# Patient Record
Sex: Female | Born: 1980
Health system: Southern US, Community
[De-identification: ages and names within clinical notes are randomized; demographics above are authoritative.]

## PROBLEM LIST (undated history)

## (undated) DIAGNOSIS — D219 Benign neoplasm of connective and other soft tissue, unspecified: Secondary | ICD-10-CM

## (undated) DIAGNOSIS — Q211 Atrial septal defect, unspecified: Secondary | ICD-10-CM

## (undated) DIAGNOSIS — D649 Anemia, unspecified: Secondary | ICD-10-CM

## (undated) HISTORY — DX: Atrial septal defect: Q21.1

## (undated) HISTORY — DX: Atrial septal defect, unspecified: Q21.10

## (undated) HISTORY — PX: FOOT SURGERY: SHX648

## (undated) HISTORY — PX: ESOPHAGOGASTRODUODENOSCOPY: SHX1529

## (undated) HISTORY — DX: Benign neoplasm of connective and other soft tissue, unspecified: D21.9

## (undated) HISTORY — DX: Anemia, unspecified: D64.9

## (undated) HISTORY — PX: OTHER SURGICAL HISTORY: SHX169

---

## 1999-07-23 ENCOUNTER — Encounter: Admission: RE | Admit: 1999-07-23 | Discharge: 1999-07-23 | Payer: Self-pay | Admitting: Internal Medicine

## 2011-06-25 DIAGNOSIS — Z98891 History of uterine scar from previous surgery: Secondary | ICD-10-CM | POA: Insufficient documentation

## 2011-12-15 DIAGNOSIS — Z9889 Other specified postprocedural states: Secondary | ICD-10-CM | POA: Insufficient documentation

## 2017-02-22 ENCOUNTER — Ambulatory Visit (INDEPENDENT_AMBULATORY_CARE_PROVIDER_SITE_OTHER): Payer: BLUE CROSS/BLUE SHIELD | Admitting: Advanced Practice Midwife

## 2017-02-22 ENCOUNTER — Encounter: Payer: Self-pay | Admitting: Advanced Practice Midwife

## 2017-02-22 VITALS — BP 110/76 | HR 81 | Ht 62.0 in | Wt 178.0 lb

## 2017-02-22 DIAGNOSIS — N898 Other specified noninflammatory disorders of vagina: Secondary | ICD-10-CM | POA: Diagnosis not present

## 2017-02-22 DIAGNOSIS — R350 Frequency of micturition: Secondary | ICD-10-CM | POA: Diagnosis not present

## 2017-02-22 MED ORDER — BORIC ACID CRYS: 600.0000 mg | CRYSTALS | Freq: Every day | 5 refills | Status: AC

## 2017-02-22 NOTE — Patient Instructions (Signed)
Bacterial Vaginosis Bacterial vaginosis is a vaginal infection that occurs when the normal balance of bacteria in the vagina is disrupted. It results from an overgrowth of certain bacteria. This is the most common vaginal infection among women ages 15-44. Because bacterial vaginosis increases your risk for STIs (sexually transmitted infections), getting treated can help reduce your risk for chlamydia, gonorrhea, herpes, and HIV (human immunodeficiency virus). Treatment is also important for preventing complications in pregnant women, because this condition can cause an early (premature) delivery. What are the causes? This condition is caused by an increase in harmful bacteria that are normally present in small amounts in the vagina. However, the reason that the condition develops is not fully understood. What increases the risk? The following factors may make you more likely to develop this condition:  Having a new sexual partner or multiple sexual partners.  Having unprotected sex.  Douching.  Having an intrauterine device (IUD).  Smoking.  Drug and alcohol abuse.  Taking certain antibiotic medicines.  Being pregnant.  You cannot get bacterial vaginosis from toilet seats, bedding, swimming pools, or contact with objects around you. What are the signs or symptoms? Symptoms of this condition include:  Grey or white vaginal discharge. The discharge can also be watery or foamy.  A fish-like odor with discharge, especially after sexual intercourse or during menstruation.  Itching in and around the vagina.  Burning or pain with urination.  Some women with bacterial vaginosis have no signs or symptoms. How is this diagnosed? This condition is diagnosed based on:  Your medical history.  A physical exam of the vagina.  Testing a sample of vaginal fluid under a microscope to look for a large amount of bad bacteria or abnormal cells. Your health care provider may use a cotton swab  or a small wooden spatula to collect the sample.  How is this treated? This condition is treated with antibiotics. These may be given as a pill, a vaginal cream, or a medicine that is put into the vagina (suppository). If the condition comes back after treatment, a second round of antibiotics may be needed. Follow these instructions at home: Medicines  Take over-the-counter and prescription medicines only as told by your health care provider.  Take or use your antibiotic as told by your health care provider. Do not stop taking or using the antibiotic even if you start to feel better. General instructions  If you have a female sexual partner, tell her that you have a vaginal infection. She should see her health care provider and be treated if she has symptoms. If you have a female sexual partner, he does not need treatment.  During treatment: ? Avoid sexual activity until you finish treatment. ? Do not douche. ? Avoid alcohol as directed by your health care provider. ? Avoid breastfeeding as directed by your health care provider.  Drink enough water and fluids to keep your urine clear or pale yellow.  Keep the area around your vagina and rectum clean. ? Wash the area daily with warm water. ? Wipe yourself from front to back after using the toilet.  Keep all follow-up visits as told by your health care provider. This is important. How is this prevented?  Do not douche.  Wash the outside of your vagina with warm water only.  Use protection when having sex. This includes latex condoms and dental dams.  Limit how many sexual partners you have. To help prevent bacterial vaginosis, it is best to have sex with just   one partner (monogamous).  Make sure you and your sexual partner are tested for STIs.  Wear cotton or cotton-lined underwear.  Avoid wearing tight pants and pantyhose, especially during summer.  Limit the amount of alcohol that you drink.  Do not use any products that  contain nicotine or tobacco, such as cigarettes and e-cigarettes. If you need help quitting, ask your health care provider.  Do not use illegal drugs. Where to find more information:  Centers for Disease Control and Prevention: www.cdc.gov/std  American Sexual Health Association (ASHA): www.ashastd.org  U.S. Department of Health and Human Services, Office on Women's Health: www.womenshealth.gov/ or https://www.womenshealth.gov/a-z-topics/bacterial-vaginosis Contact a health care provider if:  Your symptoms do not improve, even after treatment.  You have more discharge or pain when urinating.  You have a fever.  You have pain in your abdomen.  You have pain during sex.  You have vaginal bleeding between periods. Summary  Bacterial vaginosis is a vaginal infection that occurs when the normal balance of bacteria in the vagina is disrupted.  Because bacterial vaginosis increases your risk for STIs (sexually transmitted infections), getting treated can help reduce your risk for chlamydia, gonorrhea, herpes, and HIV (human immunodeficiency virus). Treatment is also important for preventing complications in pregnant women, because the condition can cause an early (premature) delivery.  This condition is treated with antibiotic medicines. These may be given as a pill, a vaginal cream, or a medicine that is put into the vagina (suppository). This information is not intended to replace advice given to you by your health care provider. Make sure you discuss any questions you have with your health care provider. Document Released: 04/13/2005 Document Revised: 12/28/2015 Document Reviewed: 12/28/2015 Elsevier Interactive Patient Education  2017 Elsevier Inc.  

## 2017-02-22 NOTE — Progress Notes (Signed)
Subjective:     Patient ID: Tiffany Dixon, female   DOB: [birth year], 36 y.o.   MRN: [id number]  HPI: Here for New patient problems Gyn visit. C/O malodorous vaginal discharge C/W her recurrent BV and urinary frequency primarily at night.   Has taken Flagyl and Metrogel many times w/out relief of Sx. Was even placed on weekly Metrogel after Full week course of Flagyl w/out improvement. Often gets BV after periods stop. Is not currently sexually active. Denies risks for STDs and declines testing. Does not want to try Flagyl or Metrogel again. Has had most success w/ Boric Acid.   Last Pap 2017 Nml w/ neg co-testing.   Review of Systems  Constitutional: Negative for chills and fever.  Gastrointestinal: Negative for abdominal pain.  Endocrine: Negative for polydipsia and polyuria.  Genitourinary: Positive for frequency and vaginal discharge. Negative for decreased urine volume, difficulty urinating, dysuria, flank pain, genital sores, hematuria, menstrual problem, pelvic pain, urgency, vaginal bleeding and vaginal pain.       Objective:BP 110/76   Pulse 81   Ht 5\' 2"  (1.575 m)   Wt 178 lb (80.7 kg)   LMP 02/18/2017 (LMP Unknown)   BMI 32.56 kg/m    Physical Exam  Constitutional: She is oriented to person, place, and time. She appears well-developed and well-nourished. No distress.  Eyes: No scleral icterus.  Neck: Normal range of motion.  Cardiovascular: Normal rate.   Pulmonary/Chest: Effort normal.  Abdominal: Soft. There is no tenderness.  Genitourinary: There is no rash, tenderness or lesion on the right labia. There is no rash, tenderness or lesion on the left labia. No erythema, tenderness or bleeding in the vagina. Vaginal discharge found.  Musculoskeletal: Normal range of motion.  Neurological: She is alert and oriented to person, place, and time.  Skin: Skin is warm and dry.  Psychiatric: She has a normal mood and affect.  Nursing note and vitals reviewed.  UA neg     Assessment/Plan:     1. Vaginal discharge  - Cervicovaginal ancillary only - Boric Acid CRYS; Place 600 mg vaginally daily. Take daily x 30 days, then use for 7 days after each menstrual period stops if you develop symptoms of BV.  Dispense: 500 g; Refill: 5  2. Urinary frequency  - Nml UA suggests no infection - Discussed other causes of urinary frequency such as IC, overactive bladder. Pt denies significant Sx and declined further workup for now.   Tamala Julian, [place], [place] 02/22/2017 3:44 PM

## 2017-02-22 NOTE — Progress Notes (Signed)
Pt c/o of reoccurring BV.

## 2017-02-23 LAB — CERVICOVAGINAL ANCILLARY ONLY
Bacterial vaginitis: POSITIVE — AB
Candida vaginitis: NEGATIVE

## 2017-02-24 ENCOUNTER — Telehealth: Payer: Self-pay

## 2017-02-24 NOTE — Telephone Encounter (Signed)
Left message on pt's phone asking her to call back for her lab results

## 2017-06-02 DIAGNOSIS — M2012 Hallux valgus (acquired), left foot: Secondary | ICD-10-CM | POA: Diagnosis not present

## 2017-06-02 DIAGNOSIS — S93149A Subluxation of metatarsophalangeal joint of unspecified toe(s), initial encounter: Secondary | ICD-10-CM | POA: Diagnosis not present

## 2017-06-02 DIAGNOSIS — M79672 Pain in left foot: Secondary | ICD-10-CM | POA: Diagnosis not present

## 2017-07-07 DIAGNOSIS — M24672 Ankylosis, left ankle: Secondary | ICD-10-CM | POA: Diagnosis not present

## 2017-07-07 DIAGNOSIS — M2042 Other hammer toe(s) (acquired), left foot: Secondary | ICD-10-CM | POA: Diagnosis not present

## 2017-07-07 DIAGNOSIS — M2012 Hallux valgus (acquired), left foot: Secondary | ICD-10-CM | POA: Diagnosis not present

## 2017-07-07 DIAGNOSIS — M21612 Bunion of left foot: Secondary | ICD-10-CM | POA: Diagnosis not present

## 2017-07-14 DIAGNOSIS — M79672 Pain in left foot: Secondary | ICD-10-CM | POA: Diagnosis not present

## 2017-07-14 DIAGNOSIS — Z4889 Encounter for other specified surgical aftercare: Secondary | ICD-10-CM | POA: Diagnosis not present

## 2017-07-14 DIAGNOSIS — Z4789 Encounter for other orthopedic aftercare: Secondary | ICD-10-CM | POA: Diagnosis not present

## 2017-09-13 DIAGNOSIS — Z4889 Encounter for other specified surgical aftercare: Secondary | ICD-10-CM | POA: Diagnosis not present

## 2017-09-18 DIAGNOSIS — D259 Leiomyoma of uterus, unspecified: Secondary | ICD-10-CM | POA: Diagnosis not present

## 2017-09-18 DIAGNOSIS — Z79891 Long term (current) use of opiate analgesic: Secondary | ICD-10-CM | POA: Diagnosis not present

## 2017-09-18 DIAGNOSIS — R9389 Abnormal findings on diagnostic imaging of other specified body structures: Secondary | ICD-10-CM | POA: Diagnosis not present

## 2017-09-18 DIAGNOSIS — R102 Pelvic and perineal pain: Secondary | ICD-10-CM | POA: Diagnosis not present

## 2017-09-18 DIAGNOSIS — N854 Malposition of uterus: Secondary | ICD-10-CM | POA: Diagnosis not present

## 2017-09-18 DIAGNOSIS — N938 Other specified abnormal uterine and vaginal bleeding: Secondary | ICD-10-CM | POA: Diagnosis not present

## 2017-09-18 DIAGNOSIS — F17200 Nicotine dependence, unspecified, uncomplicated: Secondary | ICD-10-CM | POA: Diagnosis not present

## 2017-09-18 DIAGNOSIS — Z79899 Other long term (current) drug therapy: Secondary | ICD-10-CM | POA: Diagnosis not present

## 2017-09-18 DIAGNOSIS — Z791 Long term (current) use of non-steroidal anti-inflammatories (NSAID): Secondary | ICD-10-CM | POA: Diagnosis not present

## 2017-09-27 ENCOUNTER — Encounter: Payer: Self-pay | Admitting: Certified Nurse Midwife

## 2017-09-27 ENCOUNTER — Ambulatory Visit (INDEPENDENT_AMBULATORY_CARE_PROVIDER_SITE_OTHER): Payer: BLUE CROSS/BLUE SHIELD | Admitting: Certified Nurse Midwife

## 2017-09-27 VITALS — BP 116/77 | HR 78 | Ht 61.0 in | Wt 185.0 lb

## 2017-09-27 DIAGNOSIS — Z3009 Encounter for other general counseling and advice on contraception: Secondary | ICD-10-CM

## 2017-09-27 NOTE — Progress Notes (Signed)
   GYNECOLOGY OFFICE VISIT NOTE  History:  37 y.o. [id number] here today for birth control options and discuss recent finding of uterine fibroids. She is not currently sexually active but interested in contraception. She has used OCPs and Depo in the past and is interested in other options. Reports bleeding twice last month and was seen in the ED for this. Pelvic US on 09/18/17 showed: Multiple lesions of the uterus apparently representing fibroids numbering at least 3, the largest of which measures approximately 2.8 cm. 2.  No collection or fluid of the endometrial canal. 3.  Probable physiologic functional cyst of the right adnexa measuring up to 1.5 cm with possible hemorrhagic follicle of the left adnexa measuring approximately 1.8 cm.  Past Medical History:  Diagnosis Date  . Atrial septal defect determined by imaging     Past Surgical History:  Procedure Laterality Date  . CESAREAN SECTION    . FOOT SURGERY    . open heart surgery      The following portions of the patient's history were reviewed and updated as appropriate: allergies, current medications, past family history, past medical history, past social history, past surgical history and problem list.   Review of Systems:  No VB No vaginal discharge No cramping  Objective:  Physical Exam BP 116/77   Pulse 78   Ht 5\' 1"  (1.549 m)   Wt 185 lb (83.9 kg)   LMP 09/19/2017   BMI 34.96 kg/m  CONSTITUTIONAL: Well-developed, well-nourished female in no acute distress.  HENT:  Normocephalic, atraumatic.  EYES: Conjunctivae and EOM are normal NECK: Normal range of motion SKIN: Skin is warm and dry. No rash noted. Not diaphoretic. No erythema. No pallor. NEUROLOGIC: Alert and oriented to person, place, and time.  PSYCHIATRIC: Normal mood and affect. Normal behavior.  CARDIOVASCULAR: Normal heart rate noted RESPIRATORY: Effort and breath sounds normal, no problems with respiration noted PELVIC: declined MUSCULOSKELETAL:  Normal range of motion. No edema noted.  Labs and Imaging No results found.  Assessment & Plan:  1. Counseling for contraceptive management - discussed all options of Mirena, Paragard, and Nexplanon, brochures given - pt will read literature and call back with desired option - if begins IC prior to [facility] selection, should use barrier method  2. AUB - single episode of AUB, unclear if caused by fibroids - would consider further evaluation with MD if AUB persists  Total face-to-face time with patient: 15 minutes.  Over 50% of encounter was spent on counseling and coordination of care.   Julianne Handler, CNM 09/28/2017 1:51 PM

## 2017-09-27 NOTE — Progress Notes (Signed)
Last pap Oct 2017- normal results Pt does not want exam

## 2017-09-28 ENCOUNTER — Encounter: Payer: Self-pay | Admitting: Certified Nurse Midwife

## 2017-11-03 ENCOUNTER — Telehealth: Payer: Self-pay | Admitting: Certified Nurse Midwife

## 2017-11-03 MED ORDER — IBUPROFEN 800 MG PO TABS: 800.0000 mg | ORAL_TABLET | Freq: Four times a day (QID) | ORAL | 0 refills | Status: DC | PRN

## 2017-11-03 NOTE — Telephone Encounter (Signed)
Pt called to say she is having the same symptoms again. Pt states she started her cycle on 24th and it ended 28th. Now having clots beginning 11/01/17. Heavy bleeding. Leaving town. Pt states Ibuprofen given before in ED visit. Pt will gone tomorrow to [place] and be back 11/09/17.  Please call in Ibuprofen for her to take with her on her trip tomorrow. [facility] on [address]. Pt states available on work cell or personal cell at any time today.

## 2017-12-20 ENCOUNTER — Ambulatory Visit: Payer: BLUE CROSS/BLUE SHIELD | Admitting: Obstetrics & Gynecology

## 2018-01-27 ENCOUNTER — Ambulatory Visit: Payer: BLUE CROSS/BLUE SHIELD | Admitting: Obstetrics & Gynecology

## 2018-01-31 ENCOUNTER — Encounter: Payer: Self-pay | Admitting: Obstetrics & Gynecology

## 2018-01-31 ENCOUNTER — Ambulatory Visit: Payer: BLUE CROSS/BLUE SHIELD | Admitting: Obstetrics & Gynecology

## 2018-01-31 VITALS — BP 114/71 | HR 78 | Resp 16 | Ht 61.0 in | Wt 191.0 lb

## 2018-01-31 DIAGNOSIS — N938 Other specified abnormal uterine and vaginal bleeding: Secondary | ICD-10-CM | POA: Diagnosis not present

## 2018-01-31 DIAGNOSIS — Z23 Encounter for immunization: Secondary | ICD-10-CM | POA: Diagnosis not present

## 2018-01-31 DIAGNOSIS — Z0000 Encounter for general adult medical examination without abnormal findings: Secondary | ICD-10-CM

## 2018-01-31 DIAGNOSIS — Z124 Encounter for screening for malignant neoplasm of cervix: Secondary | ICD-10-CM | POA: Diagnosis not present

## 2018-01-31 DIAGNOSIS — Z1151 Encounter for screening for human papillomavirus (HPV): Secondary | ICD-10-CM

## 2018-01-31 MED ORDER — METRONIDAZOLE 500 MG PO TABS: 500.0000 mg | ORAL_TABLET | Freq: Two times a day (BID) | ORAL | 0 refills | Status: DC

## 2018-01-31 NOTE — Progress Notes (Signed)
Patient ID: Tiffany Dixon, female   DOB: [birth year], 37 y.o.   MRN: [id number]  Chief Complaint  Patient presents with  . Menstrual Problem    HPI Tiffany Dixon is a 37 y.o. female. Single P2 (70 and 73 yo kids) here today as a new patient with the issue of  Irregular periods. She has had 2 months recently when she had 2 periods per month. These were heavy with clots. She went to the [facility] ER and was told she has fibroids. Her hbg was 9.8 on 5/19. She can't recall anything that makes this better or worse.   She has been abstinent for about 2 years. She has used OCPs for contraception. She also used depo provera but didn't like it.   She also thinks that she has a hemorrhoid.   Past Medical History:  Diagnosis Date  . Anemia   . Atrial septal defect determined by imaging     Past Surgical History:  Procedure Laterality Date  . CESAREAN SECTION    . FOOT SURGERY    . open heart surgery     ASD repair, age 49    Family History  Problem Relation Age of Onset  . Melanoma Mother     Social History Social History   Tobacco Use  . Smoking status: Never Smoker  . Smokeless tobacco: Never Used  Substance Use Topics  . Alcohol use: Yes    Comment: Socially  . Drug use: No    No Known Allergies  Current Outpatient Medications  Medication Sig Dispense Refill  . Boric Acid GRAN USE ONE CAPSULE VAGINALLY AS DIRECTED    . ferrous sulfate 325 (65 FE) MG tablet Take 325 mg by mouth daily with breakfast.    . ibuprofen (ADVIL,MOTRIN) 800 MG tablet Take by mouth.     No current facility-administered medications for this visit.     Review of Systems Review of Systems She reports that her belly gets very large at night, back to normal in the mornings.  Blood pressure 114/71, pulse 78, resp. rate 16, height 5\' 1"  (1.549 m), weight 191 lb (86.6 kg), last menstrual period 01/19/2018.  Physical Exam Physical Exam  Breathing, conversing, and ambulating normally Well  nourished, well hydrated Black female, no apparent distress Abd- obese, benign Vaginal discharge c/w BV (She has a h/o this, recurs) normal size and shape, anteverted, mobile, non-tender, normal adnexal exam  Data Reviewed I have reviewed info from [facility] in 5/19.  Assessment    Preventative care DUB Recurrent BV    Plan    TDAP, flu vaccines Pap smear today  Check CBC, TSH, gyn u/s  Flagyl prescribed Rec boric acic supp QOhs Rec probiotics  Come back 2 weeks       Tiffany Dixon 01/31/2018, 10:15 AM

## 2018-02-01 LAB — CBC
HEMATOCRIT: 35.3 % (ref 35.0–45.0)
Hemoglobin: 11.3 g/dL — ABNORMAL LOW (ref 11.7–15.5)
MCH: 27.8 pg (ref 27.0–33.0)
MCHC: 32 g/dL (ref 32.0–36.0)
MCV: 86.9 fL (ref 80.0–100.0)
MPV: 9.8 fL (ref 7.5–12.5)
Platelets: 395 10*3/uL (ref 140–400)
RBC: 4.06 10*6/uL (ref 3.80–5.10)
RDW: 12.7 % (ref 11.0–15.0)
WBC: 6.5 10*3/uL (ref 3.8–10.8)

## 2018-02-01 LAB — TSH: TSH: 1.38 m[IU]/L

## 2018-02-02 LAB — CYTOLOGY - PAP
Adequacy: ABSENT
Diagnosis: NEGATIVE
HPV: NOT DETECTED

## 2018-02-07 ENCOUNTER — Ambulatory Visit: Payer: BLUE CROSS/BLUE SHIELD

## 2018-03-31 ENCOUNTER — Other Ambulatory Visit: Payer: Self-pay | Admitting: *Deleted

## 2018-03-31 MED ORDER — BORIC ACID EX GRAN: GRANULES | CUTANEOUS | 0 refills | Status: DC

## 2018-04-26 ENCOUNTER — Ambulatory Visit (INDEPENDENT_AMBULATORY_CARE_PROVIDER_SITE_OTHER): Payer: BLUE CROSS/BLUE SHIELD

## 2018-04-26 DIAGNOSIS — D252 Subserosal leiomyoma of uterus: Secondary | ICD-10-CM | POA: Diagnosis not present

## 2018-04-26 DIAGNOSIS — N939 Abnormal uterine and vaginal bleeding, unspecified: Secondary | ICD-10-CM | POA: Diagnosis not present

## 2018-05-05 ENCOUNTER — Encounter: Payer: Self-pay | Admitting: Obstetrics & Gynecology

## 2018-05-05 ENCOUNTER — Ambulatory Visit: Payer: BLUE CROSS/BLUE SHIELD | Admitting: Obstetrics & Gynecology

## 2018-05-05 VITALS — BP 112/77 | HR 80 | Ht 61.0 in | Wt 191.0 lb

## 2018-05-05 DIAGNOSIS — R140 Abdominal distension (gaseous): Secondary | ICD-10-CM

## 2018-05-05 MED ORDER — HYDROCORTISONE ACETATE 25 MG RE SUPP: 25.0000 mg | Freq: Two times a day (BID) | RECTAL | 1 refills | Status: DC

## 2018-05-05 NOTE — Progress Notes (Signed)
Patient ID: Tiffany Dixon, female   DOB: Sep 28, 1980, 38 y.o.   MRN: [id number]  Chief Complaint  Patient presents with  . Results    HPI Tiffany Dixon is a 38 y.o. here today for follow up after being seen 10/19 for DUB. Her w/u included an u/s that showed a 2.1 cm subserosal fibroid, normal endometrium, normal TSH, hbg of 11.3.   Since that time her periods have regulated.  She would like a prescription for hemorrhoids.  She is also concerned about bloating that has been going on for "a long time, at least 8 months". She notices this at its most extreme prior to going to sleep, wakes up and her abdomen is much flatter. Looks pregnant prior to going to sleep.  Feels "weird and heavy". HPI  Past Medical History:  Diagnosis Date  . Anemia   . Atrial septal defect determined by imaging     Past Surgical History:  Procedure Laterality Date  . CESAREAN SECTION    . FOOT SURGERY    . open heart surgery     ASD repair, age 7    Family History  Problem Relation Age of Onset  . Melanoma Mother     Social History Social History   Tobacco Use  . Smoking status: Never Smoker  . Smokeless tobacco: Never Used  Substance Use Topics  . Alcohol use: Yes    Comment: Socially  . Drug use: No    No Known Allergies  Current Outpatient Medications  Medication Sig Dispense Refill  . Boric Acid GRAN USE ONE CAPSULE VAGINALLY AS DIRECTED  600mg  caps 21 Bottle 0  . ferrous sulfate 325 (65 FE) MG tablet Take 325 mg by mouth daily with breakfast.    . ibuprofen (ADVIL,MOTRIN) 800 MG tablet Take by mouth.    . hydrocortisone (ANUSOL-HC) 25 MG suppository Place 1 suppository (25 mg total) rectally 2 (two) times daily. 12 suppository 1  . metroNIDAZOLE (FLAGYL) 500 MG tablet Take 1 tablet (500 mg total) by mouth 2 (two) times daily. (Patient not taking: Reported on 05/05/2018) 14 tablet 0   No current facility-administered medications for this visit.     Review of Systems Review of  Systems  Blood pressure 112/77, pulse 80, height 5\' 1"  (1.549 m), weight 86.6 kg.  Physical Exam Physical Exam Breathing, conversing, and ambulating normally Well nourished, well hydrated Black female, no apparent distress  Data Reviewed U/s, labs  Assessment    Bloating hemorrhoids    Plan    annusol prescribed Refer to GI Trial of Gas X prn       Jerusalem Wert C Amberli Ruegg 05/05/2018, 1:33 PM

## 2018-05-18 ENCOUNTER — Other Ambulatory Visit: Payer: Self-pay | Admitting: *Deleted

## 2018-05-18 ENCOUNTER — Telehealth: Payer: Self-pay | Admitting: *Deleted

## 2018-05-18 MED ORDER — NORGESTREL-ETHINYL ESTRADIOL 0.3-30 MG-MCG PO TABS: 1.0000 | ORAL_TABLET | Freq: Every day | ORAL | 11 refills | Status: DC

## 2018-05-18 MED ORDER — BORIC ACID EX GRAN: GRANULES | CUTANEOUS | 5 refills | Status: DC

## 2018-05-18 NOTE — Telephone Encounter (Signed)
Pt wants to start OCP's as was discussed at her last visit with Dr Hulan Fray.  I have spoken with Dr Hulan Fray and she will start patient on Lo Ovral .  This RX was sent to [facility] in [place].

## 2018-07-07 ENCOUNTER — Telehealth: Payer: Self-pay | Admitting: *Deleted

## 2018-07-07 MED ORDER — IBUPROFEN 800 MG PO TABS: 800.0000 mg | ORAL_TABLET | Freq: Four times a day (QID) | ORAL | 1 refills | Status: DC | PRN

## 2018-07-07 NOTE — Telephone Encounter (Signed)
Pt called stating that she has recently been seen for vaginal bleeding and was prescribed OCP's.  She took only 4 pills then stopped because they make her feel dry in her vaginal and gave her dry mouth.  Now she has been light bleeding for 5 days.  She is requesting that she be given something to stop the bleeding and for the cramps.  I explained that stopping the pills in midstream is the reason she is bleeding again as she had a progesterone withdrawal.  She states that she has been taking OTC Ibuprofen 200 mg.  I told her she could take 4 of the 200 mgs and it would be the same as prescription strength.  She is still insisting on getting the 800 mg by prescription.  I told her that if she is not any better or if the bleeding doesn't stop she may need to be reevaluated.  Pt stated,"I don't know why I should have to come back in and tell the Dr the same thing I told her before."

## 2018-08-15 ENCOUNTER — Ambulatory Visit (INDEPENDENT_AMBULATORY_CARE_PROVIDER_SITE_OTHER): Payer: BLUE CROSS/BLUE SHIELD | Admitting: Gastroenterology

## 2018-08-15 ENCOUNTER — Other Ambulatory Visit: Payer: Self-pay

## 2018-08-15 ENCOUNTER — Encounter: Payer: Self-pay | Admitting: Gastroenterology

## 2018-08-15 DIAGNOSIS — K649 Unspecified hemorrhoids: Secondary | ICD-10-CM

## 2018-08-15 DIAGNOSIS — R140 Abdominal distension (gaseous): Secondary | ICD-10-CM

## 2018-08-15 DIAGNOSIS — K629 Disease of anus and rectum, unspecified: Secondary | ICD-10-CM | POA: Diagnosis not present

## 2018-08-15 NOTE — Progress Notes (Signed)
TELEHEALTH VISIT  Referring Provider: No ref. provider found Primary Care Physician:  Patient, No Pcp Per   Tele-visit due to COVID-19 pandemic Patient requested visit virtually, consented to the virtual encounter via video enabled telemedicine application (Zoom). Audio was not working with the patient, so we converted to a telephone call.  Contact made at: 13:31 08/15/18 Patient verified by name and date of birth Location of patient: Home Location provider: [facility] Names of persons participating: Me, patient, Tinnie Gens CMA Time spent on telehealth visit: 28 minutes I discussed the limitations of evaluation and management by telemedicine. The patient expressed understanding and agreed to proceed.  Reason for Consultation:  Hemorrhoids   IMPRESSION:  Rectal bleeding    - patient attributes Hemorrhoids not responding to Anusol Abnormality at the rectum Abdominal distension  Possible hemorrhoids not responding to local therapy. The differential for rectal bleeding is broad.  It includes outlet sources such as hemorrhoids, as well as fissure, mass, ulcers, and colitis.  Given this differential I am recommending a colonoscopy.Colonoscopy recommended.   Abdominal bloating/distension by history sounds like a small bowel etiology. Differential includes diet, bacterial overgrowth, celiac, and IBS. I am recommending an EGD with duodenal biopsies concurrently with her colonoscopy.  Given the bleeding and atypical report of rectal findings, I am recommending that we proceed with endoscopic evaluation now. She prefers to wait until coronavirus risk has decreased. Given concerns for a missed polyp or lesion, I have made it clear that she may call at any time to have her procedures scheduled more urgently.   PLAN: - Daily use of psyllium for stool bulking (Metamucil recommended) - Colonoscopy and EGD with small bowel biopsies when she is ready to proceed - Emperic treatment  for bacterial overgrowth if duodenal biopsies are negative  I consented the patient discussing the risks, benefits, and alternatives to endoscopic evaluation. In particular, we discussed the risks that include, but are not limited to, reaction to medication, cardiopulmonary compromise, bleeding requiring blood transfusion, aspiration resulting in pneumonia, perforation requiring surgery, lack of diagnosis, severe illness requiring hospitalization, and even death. We reviewed the risk of missed lesion including polyps or even cancer. The patient acknowledges these risks and asks that we proceed.   HPI: Tiffany Dixon is a 38 y.o. case Freight [employer] with [employer] referred by Dr. Hulan Fray for further evaluation of hemorrhoids. The patient also complaints of bloating.  The history is obtained to the patient and review of her electronic health record.  Intermittent bleeding with defection, with associated burning, and feeling of mucous at her rectum since January.  "I feel the extra meat" that she attributes to being hemorrhoids that she can feel at her rectum. Blood noted in the bowl.  Reviewed diagnosis of hemorrhoids with Dr. Hulan Fray 05/05/18 who made the referra to GI. No prior rectal exam. No change in bowel habits. Occassional straining with constipation. Prescribed Anusol by Dr. Hulan Fray which relieved the pain but did not make the hemorrhoid go away.  Has had intermittent bleeding since January. But, she feels that the volume of bleeding has increased. No trauma, tenesmus, or urgency.  No other associated symptoms. No identified exacerbating or relieving features.   Reports abdominal distension that occurs as the day progresses.  Occurs daily since January. Occurs within an hour of eating and becomes progressively worse during the day. Worse at night time. But, distension is improved in the morning.  No associated abdominal pain, belching, diarrhea or constipation.  Originally thought to be  a fibroid, but, the  transvaginal ultrasound was reassuring. Trial of GasEx did not provide any additional results.  Appetite is good. Weight is stable.   No prior abdominal imaging. Prior endoscopic evaluation in 2012 to evaluate abdominal pain. She thinks this was performed in [place]. She remembers the results being normal.   No known family history of colon cancer or polyps. No family history of uterine/endometrial cancer, pancreatic cancer or gastric/stomach cancer.  Past Medical History:  Diagnosis Date  . Anemia   . Atrial septal defect determined by imaging     Past Surgical History:  Procedure Laterality Date  . CESAREAN SECTION    . FOOT SURGERY    . open heart surgery     ASD repair, age 38    Current Outpatient Medications  Medication Sig Dispense Refill  . Boric Acid GRAN USE ONE CAPSULE VAGINALLY AS DIRECTED  600mg  caps 21 Bottle 5  . ferrous sulfate 325 (65 FE) MG tablet Take 325 mg by mouth daily with breakfast.    . hydrocortisone (ANUSOL-HC) 25 MG suppository Place 1 suppository (25 mg total) rectally 2 (two) times daily. 12 suppository 1  . ibuprofen (ADVIL,MOTRIN) 800 MG tablet Take 1 tablet (800 mg total) by mouth every 6 (six) hours as needed. 30 tablet 1  . metroNIDAZOLE (FLAGYL) 500 MG tablet Take 1 tablet (500 mg total) by mouth 2 (two) times daily. (Patient not taking: Reported on 05/05/2018) 14 tablet 0  . norgestrel-ethinyl estradiol (LO/OVRAL,CRYSELLE) 0.3-30 MG-MCG tablet Take 1 tablet by mouth daily. 1 Package 11   No current facility-administered medications for this visit.     Allergies as of 08/15/2018  . (No Known Allergies)    Family History  Problem Relation Age of Onset  . Melanoma Mother     Social History   Socioeconomic History  . Marital status: Single    Spouse name: Not on file  . Number of children: Not on file  . Years of education: Not on file  . Highest education level: Not on file  Occupational History  . Not on file  Social Needs  .  Financial resource strain: Not on file  . Food insecurity:    Worry: Not on file    Inability: Not on file  . Transportation needs:    Medical: Not on file    Non-medical: Not on file  Tobacco Use  . Smoking status: Never Smoker  . Smokeless tobacco: Never Used  Substance and Sexual Activity  . Alcohol use: Yes    Comment: Socially  . Drug use: No  . Sexual activity: Yes    Birth control/protection: None  Lifestyle  . Physical activity:    Days per week: Not on file    Minutes per session: Not on file  . Stress: Not on file  Relationships  . Social connections:    Talks on phone: Not on file    Gets together: Not on file    Attends religious service: Not on file    Active member of club or organization: Not on file    Attends meetings of clubs or organizations: Not on file    Relationship status: Not on file  . Intimate partner violence:    Fear of current or ex partner: Not on file    Emotionally abused: Not on file    Physically abused: Not on file    Forced sexual activity: Not on file  Other Topics Concern  . Not on file  Social  History Narrative  . Not on file    Review of Systems: ALL ROS discussed and all others negative except listed in HPI.  Physical Exam: General: in no acute distress Neuro: Alert and appropriate Psych: Normal affect and normal insight Exam limited due to telehealth technology.   Henleigh Robello L. Tarri Glenn, MD, MPH [facility] 08/15/2018, 3:59 PM

## 2018-08-15 NOTE — Patient Instructions (Signed)
Daily use of psyllium for stool bulking agent such as metamucil recommended.  Please call me to schedule a colonoscopy and EGD when you are ready. Given your bleeding, continued problems despite suppositories,  and that feeling that you have at your rectum, I would want to figure out what is going on sooner rather than later.   Thank you for your patience with me and our technology today! Please stay home, safe, and healthy. I look forward to meeting you in person in the future.

## 2018-10-20 ENCOUNTER — Telehealth: Payer: Self-pay

## 2018-10-20 ENCOUNTER — Encounter: Payer: Self-pay | Admitting: Nurse Practitioner

## 2018-10-20 ENCOUNTER — Ambulatory Visit (INDEPENDENT_AMBULATORY_CARE_PROVIDER_SITE_OTHER): Payer: BC Managed Care – PPO | Admitting: Nurse Practitioner

## 2018-10-20 VITALS — BP 118/76 | HR 98 | Temp 98.4°F | Ht 61.0 in | Wt 198.0 lb

## 2018-10-20 DIAGNOSIS — K625 Hemorrhage of anus and rectum: Secondary | ICD-10-CM | POA: Diagnosis not present

## 2018-10-20 DIAGNOSIS — R140 Abdominal distension (gaseous): Secondary | ICD-10-CM

## 2018-10-20 MED ORDER — NA SULFATE-K SULFATE-MG SULF 17.5-3.13-1.6 GM/177ML PO SOLN: ORAL | 0 refills | Status: DC

## 2018-10-20 NOTE — Telephone Encounter (Signed)
Covid-19 screening questions   Do you now or have you had a fever in the last 14 days? No  Do you have any respiratory symptoms of shortness of breath or cough now or in the last 14 days? No  Do you have any family members or close contacts with diagnosed or suspected Covid-19 in the past 14 days? No  Have you been tested for Covid-19 and found to be positive? No        

## 2018-10-20 NOTE — Progress Notes (Signed)
Chief Complaint:  Rectal bleeding / hemorrhoids.      IMPRESSION and PLAN:    41.  38 year old female with several month history of ntermittent rectal bleeding with defecation  over a year.  She has internal hemorrhoids on anoscopy.  Bleeding probably secondary to hemorrhoids but colonoscopy warranted to rule out other etiologies.  Of note there was a mobile lesion felt on DRE ( posterior wall), not appreciated on anoscopy.  The lesion was fleshy-like consistency -The risks and benefits of colonoscopy with possible polypectomy / biopsies were discussed and the patient agrees to proceed.   2.  Bloating, worse in the evenings. She and Dr. Tarri Glenn discussed EGD with small bowel biopsies at the time of their Telehealth visit late April.  -The risks and benefits of EGD were discussed and the patient agrees to proceed.    HPI:     Patient is a 38 year old female who had a telehealth visit with Dr. Tarri Glenn late April for evaluation of hemorrhoids / burning discomfort / rectal bleeding and abdominal bloating.  Intermittent rectal bleeding started in January, around the same time she was also being worked up for unexplained vaginal bleeding.  Vaginal bleeding has since resolved.  Continues to have intermittent painless rectal bleeding but mainly when straining.  She used the Anusol prescribed by GYN and it relieved some of the discomfort but did not make the hemorrhoid go away.  She hasn't had any bleeding in a month now. At the time of her telehealth visit late April we recommended psyllium, colonoscopy and also EGD (with small bowel biopsies for bloating).  She is here to discuss the plan in person.   She continues to have frequent bloating,  worse in the evening.  Other GI complaints.  Patient is taking iron which of course can be constipating though she does not take it every day.   Review of systems:     No chest pain, no SOB, no fevers, no urinary sx   Past Medical History:  Diagnosis  Date  . Anemia   . Atrial septal defect determined by imaging   . Fibroids     Patient's surgical history, family medical history, social history, medications and allergies were all reviewed in Epic   Creatinine clearance cannot be calculated (No successful lab value found.)  Current Outpatient Medications  Medication Sig Dispense Refill  . ferrous sulfate 325 (65 FE) MG tablet Take 325 mg by mouth daily with breakfast.    . Boric Acid GRAN USE ONE CAPSULE VAGINALLY AS DIRECTED  600mg  caps (Patient not taking: Reported on 10/20/2018) 21 Bottle 5  . ibuprofen (ADVIL,MOTRIN) 800 MG tablet Take 1 tablet (800 mg total) by mouth every 6 (six) hours as needed. (Patient not taking: Reported on 10/20/2018) 30 tablet 1   No current facility-administered medications for this visit.     Physical Exam:     BP 118/76   Pulse 98   Temp 98.4 F (36.9 C)   Ht 5\' 1"  (1.549 m)   Wt 198 lb (89.8 kg)   BMI 37.41 kg/m   GENERAL:  Pleasant female in NAD PSYCH: : Cooperative, normal affect EENT:  conjunctiva pink, mucous membranes moist, neck supple without masses CARDIAC:  RRR, no murmur heard, no peripheral edema PULM: Normal respiratory effort, lungs CTA bilaterally, no wheezing ABDOMEN:  Nondistended, soft, nontender. No obvious masses, no hepatomegaly,  normal bowel sounds RECTAL: Small fleshy skin tag.  On DRE there was a mobile  lesion, felt fleshy like on (?posterior wall).  Initially thought it was stool but unable to remove it.  On anoscopy there was a large swollen internal hemorrhoid.  I was unable to see the lesion I was feeling on DRE, perhaps the anoscope was compressing it Musculoskeletal:  Normal muscle tone, normal strength NEURO: Alert and oriented x 3, no focal neurologic deficits   Tye Savoy , NP 10/20/2018, 3:43 PM   Cc: Clovia Cuff, MD

## 2018-10-20 NOTE — Patient Instructions (Signed)
If you are age 40 or older, your body mass index should be between 23-30. Your Body mass index is 37.41 kg/m. If this is out of the aforementioned range listed, please consider follow up with your Primary Care Provider.  If you are age 79 or younger, your body mass index should be between 19-25. Your Body mass index is 37.41 kg/m. If this is out of the aformentioned range listed, please consider follow up with your Primary Care Provider.   You have been scheduled for an endoscopy and colonoscopy. Please follow the written instructions given to you at your visit today. Please pick up your prep supplies at the pharmacy within the next 1-3 days. If you use inhalers (even only as needed), please bring them with you on the day of your procedure. Your physician has requested that you go to www.startemmi.com and enter the access code given to you at your visit today. This web site gives a general overview about your procedure. However, you should still follow specific instructions given to you by our office regarding your preparation for the procedure.  We have sent the following medications to your pharmacy for you to pick up at your convenience: Suprep  Take Metamucil every day.  Take Colace at bedtime.  Thank you for choosing me and [facility].   Tye Savoy, NP

## 2018-10-21 NOTE — Progress Notes (Signed)
Reviewed. I agree with documentation including the assessment and plan.  Lowery Paullin L. Konrad Hoak, MD, MPH 

## 2018-11-29 ENCOUNTER — Telehealth: Payer: Self-pay | Admitting: Gastroenterology

## 2018-11-29 NOTE — Telephone Encounter (Signed)
Spoke with patient regarding Covid-19 screening questions °Covid-19 Screening Questions: ° °Do you now or have you had a fever in the last 14 days? no ° °Do you have any respiratory symptoms of shortness of breath or cough now or in the last 14 days? no ° °Do you have any family members or close contacts with diagnosed or suspected Covid-19 in the past 14 days? no  ° °Have you been tested for Covid-19 and found to be positive? No ° °Pt made aware of that care partner may wait in the car or come up to the lobby during the procedure but will need to provide their own mask. °

## 2018-11-30 ENCOUNTER — Other Ambulatory Visit: Payer: Self-pay

## 2018-11-30 ENCOUNTER — Encounter: Payer: Self-pay | Admitting: Gastroenterology

## 2018-11-30 ENCOUNTER — Ambulatory Visit (AMBULATORY_SURGERY_CENTER): Payer: BC Managed Care – PPO | Admitting: Gastroenterology

## 2018-11-30 VITALS — BP 119/73 | HR 70 | Temp 99.3°F | Resp 18 | Ht 61.0 in | Wt 198.0 lb

## 2018-11-30 DIAGNOSIS — D125 Benign neoplasm of sigmoid colon: Secondary | ICD-10-CM

## 2018-11-30 DIAGNOSIS — K635 Polyp of colon: Secondary | ICD-10-CM | POA: Diagnosis not present

## 2018-11-30 DIAGNOSIS — K2970 Gastritis, unspecified, without bleeding: Secondary | ICD-10-CM

## 2018-11-30 DIAGNOSIS — R140 Abdominal distension (gaseous): Secondary | ICD-10-CM

## 2018-11-30 DIAGNOSIS — K625 Hemorrhage of anus and rectum: Secondary | ICD-10-CM

## 2018-11-30 DIAGNOSIS — K2950 Unspecified chronic gastritis without bleeding: Secondary | ICD-10-CM | POA: Diagnosis not present

## 2018-11-30 DIAGNOSIS — K648 Other hemorrhoids: Secondary | ICD-10-CM | POA: Diagnosis not present

## 2018-11-30 MED ORDER — SODIUM CHLORIDE 0.9 % IV SOLN: 500.0000 mL | Freq: Once | INTRAVENOUS | Status: DC

## 2018-11-30 NOTE — Patient Instructions (Signed)
Please read handouts provided. Await pathology results. High Fiber diet recommended. Use Metamucil or Benefiber once or twice daily to insure soft, bulky stools. Continue present medications, including Anusol. Drink at least 64 ounces of water every day.         YOU HAD AN ENDOSCOPIC PROCEDURE TODAY AT [facility]:   Refer to the procedure report that was given to you for any specific questions about what was found during the examination.  If the procedure report does not answer your questions, please call your gastroenterologist to clarify.  If you requested that your care partner not be given the details of your procedure findings, then the procedure report has been included in a sealed envelope for you to review at your convenience later.  YOU SHOULD EXPECT: Some feelings of bloating in the abdomen. Passage of more gas than usual.  Walking can help get rid of the air that was put into your GI tract during the procedure and reduce the bloating. If you had a lower endoscopy (such as a colonoscopy or flexible sigmoidoscopy) you may notice spotting of blood in your stool or on the toilet paper. If you underwent a bowel prep for your procedure, you may not have a normal bowel movement for a few days.  Please Note:  You might notice some irritation and congestion in your nose or some drainage.  This is from the oxygen used during your procedure.  There is no need for concern and it should clear up in a day or so.  SYMPTOMS TO REPORT IMMEDIATELY:   Following lower endoscopy (colonoscopy or flexible sigmoidoscopy):  Excessive amounts of blood in the stool  Significant tenderness or worsening of abdominal pains  Swelling of the abdomen that is new, acute  Fever of 100F or higher   Following upper endoscopy (EGD)  Vomiting of blood or coffee ground material  New chest pain or pain under the shoulder blades  Painful or persistently difficult swallowing  New shortness of  breath  Fever of 100F or higher  Black, tarry-looking stools  For urgent or emergent issues, a gastroenterologist can be reached at any hour by calling [phone].   DIET:  We do recommend a small meal at first, but then you may proceed to your regular diet.  Drink plenty of fluids but you should avoid alcoholic beverages for 24 hours.  ACTIVITY:  You should plan to take it easy for the rest of today and you should NOT DRIVE or use heavy machinery until tomorrow (because of the sedation medicines used during the test).    FOLLOW UP: Our staff will call the number listed on your records 48-72 hours following your procedure to check on you and address any questions or concerns that you may have regarding the information given to you following your procedure. If we do not reach you, we will leave a message.  We will attempt to reach you two times.  During this call, we will ask if you have developed any symptoms of COVID 19. If you develop any symptoms (ie: fever, flu-like symptoms, shortness of breath, cough etc.) before then, please call [phone].  If you test positive for Covid 19 in the 2 weeks post procedure, please call and report this information to [place].    If any biopsies were taken you will be contacted by phone or by letter within the next 1-3 weeks.  Please call us at [phone] if you have not heard about the biopsies  in 3 weeks.    SIGNATURES/CONFIDENTIALITY: You and/or your care partner have signed paperwork which will be entered into your electronic medical record.  These signatures attest to the fact that that the information above on your After Visit Summary has been reviewed and is understood.  Full responsibility of the confidentiality of this discharge information lies with you and/or your care-partner.

## 2018-11-30 NOTE — Progress Notes (Signed)
Called to room to assist during endoscopic procedure.  Patient ID and intended procedure confirmed with present staff. Received instructions for my participation in the procedure from the performing physician.  

## 2018-11-30 NOTE — Progress Notes (Signed)
Temp taken by WR VS taken by CW 

## 2018-11-30 NOTE — Progress Notes (Signed)
To PACU, VSS. Report to [email or web address] 

## 2018-11-30 NOTE — Op Note (Signed)
[place] Patient Name: Tiffany Dixon Procedure Date: 11/30/2018 12:53 PM MRN: [id number] Endoscopist: Thornton Park MD, MD Age: 38 Referring MD:  Date of Birth: 05/29/1980 Gender: Female Account #: [id number] Procedure:                Colonoscopy Indications:              Rectal bleeding, abnormal rectal exam                           No known family history of colon cancer or polyps Medicines:                See the Anesthesia note for documentation of the                            administered medications Procedure:                Pre-Anesthesia Assessment:                           - Prior to the procedure, a History and Physical                            was performed, and patient medications and                            allergies were reviewed. The patient's tolerance of                            previous anesthesia was also reviewed. The risks                            and benefits of the procedure and the sedation                            options and risks were discussed with the patient.                            All questions were answered, and informed consent                            was obtained. Prior Anticoagulants: The patient has                            taken no previous anticoagulant or antiplatelet                            agents. ASA Grade Assessment: II - A patient with                            mild systemic disease. After reviewing the risks                            and benefits, the patient was deemed in  satisfactory condition to undergo the procedure.                           After obtaining informed consent, the colonoscope                            was passed under direct vision. Throughout the                            procedure, the patient's blood pressure, pulse, and                            oxygen saturations were monitored continuously. The                            Colonoscope was  introduced through the anus and                            advanced to the the terminal ileum, with                            identification of the appendiceal orifice and IC                            valve. A second forward view of the right colon was                            performed. The colonoscopy was performed without                            difficulty. The patient tolerated the procedure                            well. The quality of the bowel preparation was                            excellent. The terminal ileum, ileocecal valve,                            appendiceal orifice, and rectum were photographed. Scope In: 1:16:26 PM Scope Out: 1:28:14 PM Scope Withdrawal Time: 0 hours 7 minutes 6 seconds  Total Procedure Duration: 0 hours 11 minutes 48 seconds  Findings:                 The perianal and digital rectal examinations were                            normal.                           Bulky, non-bleeding internal hemorrhoids were                            found. The hemorrhoids were moderate. No other  rectal mucosal abnormalities seen when the rectum                            was fully insufflated.                           A 2 mm polyp was found in the distal sigmoid colon.                            The polyp was sessile. The polyp was removed with a                            cold snare. Resection and retrieval were complete.                            Estimated blood loss was minimal.                           The exam was otherwise without abnormality on                            direct and retroflexion views. Complications:            No immediate complications. Estimated blood loss:                            Minimal. Estimated Blood Loss:     Estimated blood loss was minimal. Impression:               - Non-bleeding internal hemorrhoids. These are                            thought to be the source of her rectal bleeding.                            - One 2 mm polyp in the distal sigmoid colon,                            removed with a cold snare. Resected and retrieved.                           - The examination was otherwise normal on direct                            and retroflexion views. Recommendation:           - Patient has a contact number available for                            emergencies. The signs and symptoms of potential                            delayed complications were discussed with the  patient. Return to normal activities tomorrow.                            Written discharge instructions were provided to the                            patient.                           - Resume previous diet today.                           - High fiber diet recommended. Use Metamucil or                            Benefiber once or twice daily to insure soft, bulky                            stools.                           - Drink at least 64 ounces of water every day.                           - Continue present medications including Anusol.                           - Maximize supportive care for hemorrhoids.                            Consider banding if bleeding persists despite                            medical therapy.                           - Await pathology results.                           - Repeat colonoscopy in 7 years for surveillance if                            the polyp is an adenoma. Otherwise, resume colon                            cancer screening at age 46. Thornton Park MD, MD 11/30/2018 1:42:20 PM This report has been signed electronically.

## 2018-11-30 NOTE — Op Note (Signed)
[facility] Patient Name: Tiffany Dixon Procedure Date: 11/30/2018 12:54 PM MRN: [id number] Endoscopist: Thornton Park MD, MD Age: 38 Referring MD:  Date of Birth: July 15, 1980 Gender: Female Account #: [id number] Procedure:                Upper GI endoscopy Indications:              Abdominal bloating Medicines:                See the Anesthesia note for documentation of the                            administered medications Procedure:                Pre-Anesthesia Assessment:                           - Prior to the procedure, a History and Physical                            was performed, and patient medications and                            allergies were reviewed. The patient's tolerance of                            previous anesthesia was also reviewed. The risks                            and benefits of the procedure and the sedation                            options and risks were discussed with the patient.                            All questions were answered, and informed consent                            was obtained. Prior Anticoagulants: The patient has                            taken no previous anticoagulant or antiplatelet                            agents. ASA Grade Assessment: II - A patient with                            mild systemic disease. After reviewing the risks                            and benefits, the patient was deemed in                            satisfactory condition to undergo the procedure.  After obtaining informed consent, the endoscope was                            passed under direct vision. Throughout the                            procedure, the patient's blood pressure, pulse, and                            oxygen saturations were monitored continuously. The                            Endoscope was introduced through the mouth, and                            advanced to the third part of  duodenum. The upper                            GI endoscopy was accomplished without difficulty.                            The patient tolerated the procedure well. Scope In: Scope Out: Findings:                 The esophagus was normal.                           The entire examined stomach was normal except for                            mild erythema in the body. Biopsies were taken with                            a cold forceps for histology. Estimated blood loss                            was minimal.                           The examined duodenum was normal. Biopsies were                            taken with a cold forceps for histology. Estimated                            blood loss was minimal.                           The cardia and gastric fundus were normal on                            retroflexion.                           The exam was otherwise without abnormality. Complications:  No immediate complications. Estimated blood loss:                            Minimal. Estimated Blood Loss:     Estimated blood loss was minimal. Impression:               - Normal esophagus.                           - Essentially normal stomach. Biopsied.                           - Normal examined duodenum. Biopsied.                           - The examination was otherwise normal. Recommendation:           - Patient has a contact number available for                            emergencies. The signs and symptoms of potential                            delayed complications were discussed with the                            patient. Return to normal activities tomorrow.                            Written discharge instructions were provided to the                            patient.                           - Resume previous diet today.                           - Continue present medications.                           - Await pathology results.                           -  Proceed with colonoscopy today as previously                            planned. Thornton Park MD, MD 11/30/2018 1:35:48 PM This report has been signed electronically.

## 2018-12-02 ENCOUNTER — Telehealth: Payer: Self-pay

## 2018-12-02 NOTE — Telephone Encounter (Signed)
   Follow up Call-  Call back number 11/30/2018  Post procedure Call Back phone  # [phone]  Permission to leave phone message Yes  Some recent data might be hidden     Patient questions:  Do you have a fever, pain , or abdominal swelling? Yes.   getting better though Pain Score  2 *  Have you tolerated food without any problems? Yes.    Have you been able to return to your normal activities? Yes.    Do you have any questions about your discharge instructions: Diet   No. Medications  No. Follow up visit  No.  Do you have questions or concerns about your Care? No.  Actions: * If pain score is 4 or above: No action needed, pain <4.    1.   Have you developed a fever since your procedure? NO  2.   Have you had an respiratory symptoms (SOB or cough) since your procedure? No  3.   Have you tested positive for COVID 19 since your procedure no  4.   Have you had any family members/close contacts diagnosed with the COVID 19 since your procedure?  no   If yes to any of these questions please route to Joylene John, RN and Alphonsa Gin, Therapist, sports.

## 2018-12-07 ENCOUNTER — Encounter: Payer: Self-pay | Admitting: Gastroenterology

## 2019-04-13 ENCOUNTER — Ambulatory Visit: Payer: BC Managed Care – PPO | Admitting: Obstetrics and Gynecology

## 2019-06-16 ENCOUNTER — Other Ambulatory Visit: Payer: Self-pay

## 2019-06-16 ENCOUNTER — Ambulatory Visit (INDEPENDENT_AMBULATORY_CARE_PROVIDER_SITE_OTHER): Payer: BC Managed Care – PPO | Admitting: Certified Nurse Midwife

## 2019-06-16 ENCOUNTER — Encounter: Payer: Self-pay | Admitting: Certified Nurse Midwife

## 2019-06-16 VITALS — BP 103/72 | HR 92 | Temp 98.5°F | Resp 16 | Ht 61.0 in | Wt 197.0 lb

## 2019-06-16 DIAGNOSIS — Z01419 Encounter for gynecological examination (general) (routine) without abnormal findings: Secondary | ICD-10-CM

## 2019-06-16 DIAGNOSIS — N760 Acute vaginitis: Secondary | ICD-10-CM

## 2019-06-16 DIAGNOSIS — Z3202 Encounter for pregnancy test, result negative: Secondary | ICD-10-CM | POA: Diagnosis not present

## 2019-06-16 DIAGNOSIS — K648 Other hemorrhoids: Secondary | ICD-10-CM

## 2019-06-16 DIAGNOSIS — B9689 Other specified bacterial agents as the cause of diseases classified elsewhere: Secondary | ICD-10-CM

## 2019-06-16 DIAGNOSIS — N912 Amenorrhea, unspecified: Secondary | ICD-10-CM

## 2019-06-16 LAB — POCT URINE PREGNANCY: Preg Test, Ur: NEGATIVE

## 2019-06-16 MED ORDER — BORIC ACID EX GRAN: GRANULES | CUTANEOUS | 5 refills | Status: DC

## 2019-06-16 NOTE — Progress Notes (Signed)
Gynecology Annual Exam   History of Present Illness: Tiffany Dixon is a 39 y.o. single female presenting for an annual exam. She has complaints regarding bleeding/painful hemorrhoids. Reports rectal bleeding with some hard stools, not every time. She feels them externally at times and pushes them back in. She has tried OTC creams but hasn't helped. She is requesting refill of boric acid for persistent BV. She tried probiotics but didn't see a difference. She is sexually active. She uses condoms. Her menses is late, HPT was negative. She does not perform self breast exams. There is no notable family history of breast or ovarian cancer in her family. She denies current symptoms of depression.    Past Medical History:  Past Medical History:  Diagnosis Date  . Anemia   . Atrial septal defect determined by imaging   . Fibroids     Past Surgical History:  Past Surgical History:  Procedure Laterality Date  . CESAREAN SECTION    . ESOPHAGOGASTRODUODENOSCOPY     2012 or 2013 In [place]    . open heart surgery     ASD repair, age 34    Gynecologic History:  LMP: Patient's last menstrual period was 04/25/2019. Average Interval: regular Heavy Menses: no Clots: no Intermenstrual Bleeding: no Postcoital Bleeding: no Dysmenorrhea: no Contraception: condoms Last Pap: completed on 01/2018 ; result was: NIL and HR HPV negative  Mammogram: never had  Obstetric History: [id number]  Family History:  Family History  Problem Relation Age of Onset  . Melanoma Mother   . Colon cancer Other   . Esophageal cancer Neg Hx   . Stomach cancer Neg Hx   . Rectal cancer Neg Hx     Social History:  Social History   Socioeconomic History  . Marital status: Single    Spouse name: Not on file  . Number of children: Not on file  . Years of education: Not on file  . Highest education level: Not on file  Occupational History  . Not on file  Tobacco Use  . Smoking status:  Current Some Day Smoker    Types: Cigars  . Smokeless tobacco: Never Used  . Tobacco comment: Ocassionally smoking  Substance and Sexual Activity  . Alcohol use: Yes    Comment: Socially  . Drug use: No  . Sexual activity: Yes    Birth control/protection: None  Other Topics Concern  . Not on file  Social History Narrative  . Not on file   Social Determinants of Health   Financial Resource Strain:   . Difficulty of Paying Living Expenses: Not on file  Food Insecurity:   . Worried About Charity fundraiser in the Last Year: Not on file  . Ran Out of Food in the Last Year: Not on file  Transportation Needs:   . Lack of Transportation (Medical): Not on file  . Lack of Transportation (Non-Medical): Not on file  Physical Activity:   . Days of Exercise per Week: Not on file  . Minutes of Exercise per Session: Not on file  Stress:   . Feeling of Stress : Not on file  Social Connections:   . Frequency of Communication with Friends and Family: Not on file  . Frequency of Social Gatherings with Friends and Family: Not on file  . Attends Religious Services: Not on file  . Active Member of Clubs or Organizations: Not on file  . Attends Archivist Meetings: Not on file  .  Marital Status: Not on file  Intimate Partner Violence:   . Fear of Current or Ex-Partner: Not on file  . Emotionally Abused: Not on file  . Physically Abused: Not on file  . Sexually Abused: Not on file    Allergies:  No Known Allergies  Medications: Prior to Admission medications   Medication Sig Start Date End Date Taking? Authorizing Provider  Boric Acid GRAN USE ONE CAPSULE VAGINALLY AS DIRECTED  600mg  caps 05/18/18  Yes Dove, Myra C, MD  ibuprofen (ADVIL,MOTRIN) 800 MG tablet Take 1 tablet (800 mg total) by mouth every 6 (six) hours as needed. Patient not taking: Reported on 10/20/2018 07/07/18   Emily Filbert, MD    Review of Systems: negative except noted in HPI  Physical Exam Vitals: BP  103/72   Pulse 92   Temp 98.5 F (36.9 C)   Resp 16   Ht 5\' 1"  (1.549 m)   Wt 197 lb (89.4 kg)   LMP 04/25/2019   BMI 37.22 kg/m  General: NAD HEENT: normocephalic, atraumatic Thyroid: no enlargement, no palpable nodules Pulmonary: Normal rate and effort, CTAB Cardiovascular: RRR Breast: Breast symmetrical, no tenderness, no palpable nodules or masses, no skin or nipple retraction present, no nipple discharge. No axillary or supraclavicular lymphadenopathy. Abdomen: soft, non-tender, non-distended. No hepatomegaly, splenomegaly or masses palpable. No evidence of hernia  Genitourinary:  External: Normal external female genitalia. Normal urethral meatus  Vagina: deferred  Cervix: deferred  Uterus: deferred  Adnexa: deferred  Rectal: no hemorrhoids visible externally, none palpated internally Extremities: no edema, erythema, or tenderness Neurologic: Grossly intact Psychiatric: mood appropriate, affect full  Female chaperone present for pelvic and breast portions of the physical exam  Results for orders placed or performed in visit on 06/16/19 (from the past 24 hour(s))  POCT urine pregnancy     Status: Normal   Collection Time: 06/16/19  9:13 AM  Result Value Ref Range   Preg Test, Ur Negative Negative    Assessment:  1. Amenorrhea   2. Internal hemorrhoids   3. Bacterial vaginosis   4. Well woman exam    Plan: Recommend SBE and yearly mammogram- ordered Recommend lactobacillus and acidophilus daily probiotics, Rx boric acid prn vaginally Recommend consistent use of contraception unless desires pregnancy Recommend increase dietary fiber and water, may add stool softener Follow up with GYN in 1 year or prn Follow up with PCP- doesn't have PCP, high encouraged establishing care  Julianne Handler, CNM 06/16/2019 11:07 AM

## 2019-06-19 ENCOUNTER — Other Ambulatory Visit: Payer: Self-pay | Admitting: *Deleted

## 2019-06-19 ENCOUNTER — Encounter: Payer: Self-pay | Admitting: *Deleted

## 2019-06-19 DIAGNOSIS — B9689 Other specified bacterial agents as the cause of diseases classified elsewhere: Secondary | ICD-10-CM

## 2019-06-19 DIAGNOSIS — N760 Acute vaginitis: Secondary | ICD-10-CM

## 2019-06-19 MED ORDER — BORIC ACID EX GRAN: GRANULES | CUTANEOUS | 5 refills | Status: DC

## 2019-06-19 NOTE — Telephone Encounter (Signed)
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

## 2019-10-25 DIAGNOSIS — N939 Abnormal uterine and vaginal bleeding, unspecified: Secondary | ICD-10-CM | POA: Diagnosis not present

## 2019-10-25 DIAGNOSIS — N946 Dysmenorrhea, unspecified: Secondary | ICD-10-CM | POA: Diagnosis not present

## 2019-10-25 DIAGNOSIS — N920 Excessive and frequent menstruation with regular cycle: Secondary | ICD-10-CM | POA: Diagnosis not present

## 2019-10-25 DIAGNOSIS — D259 Leiomyoma of uterus, unspecified: Secondary | ICD-10-CM | POA: Diagnosis not present

## 2019-12-08 DIAGNOSIS — N946 Dysmenorrhea, unspecified: Secondary | ICD-10-CM | POA: Diagnosis not present

## 2019-12-08 DIAGNOSIS — N921 Excessive and frequent menstruation with irregular cycle: Secondary | ICD-10-CM | POA: Diagnosis not present

## 2019-12-08 DIAGNOSIS — D219 Benign neoplasm of connective and other soft tissue, unspecified: Secondary | ICD-10-CM | POA: Diagnosis not present

## 2019-12-18 DIAGNOSIS — D251 Intramural leiomyoma of uterus: Secondary | ICD-10-CM | POA: Diagnosis not present

## 2019-12-18 DIAGNOSIS — D252 Subserosal leiomyoma of uterus: Secondary | ICD-10-CM | POA: Diagnosis not present

## 2019-12-18 DIAGNOSIS — N939 Abnormal uterine and vaginal bleeding, unspecified: Secondary | ICD-10-CM | POA: Diagnosis not present

## 2019-12-18 DIAGNOSIS — N921 Excessive and frequent menstruation with irregular cycle: Secondary | ICD-10-CM | POA: Diagnosis not present

## 2020-02-21 DIAGNOSIS — N736 Female pelvic peritoneal adhesions (postinfective): Secondary | ICD-10-CM | POA: Diagnosis not present

## 2020-02-21 DIAGNOSIS — Z302 Encounter for sterilization: Secondary | ICD-10-CM | POA: Diagnosis not present

## 2020-02-21 DIAGNOSIS — F17210 Nicotine dependence, cigarettes, uncomplicated: Secondary | ICD-10-CM | POA: Diagnosis not present

## 2020-02-21 DIAGNOSIS — E669 Obesity, unspecified: Secondary | ICD-10-CM | POA: Diagnosis not present

## 2020-02-21 DIAGNOSIS — N939 Abnormal uterine and vaginal bleeding, unspecified: Secondary | ICD-10-CM | POA: Diagnosis not present

## 2020-02-21 DIAGNOSIS — Z6837 Body mass index (BMI) 37.0-37.9, adult: Secondary | ICD-10-CM | POA: Diagnosis not present

## 2020-10-16 ENCOUNTER — Other Ambulatory Visit: Payer: Self-pay | Admitting: *Deleted

## 2020-10-16 ENCOUNTER — Telehealth: Payer: Self-pay | Admitting: *Deleted

## 2020-10-16 NOTE — Telephone Encounter (Signed)
Received a RF request from [facility] for Boric Acid supp.  1 RF given as pt is overdue for her annual.

## 2020-10-16 NOTE — Telephone Encounter (Signed)
Patient had annual with [facility] on 07/05/2020. Would like next refill for 6 months instead of monthly.

## 2021-01-06 ENCOUNTER — Telehealth: Payer: Self-pay

## 2021-01-06 DIAGNOSIS — N760 Acute vaginitis: Secondary | ICD-10-CM

## 2021-01-06 DIAGNOSIS — B9689 Other specified bacterial agents as the cause of diseases classified elsewhere: Secondary | ICD-10-CM

## 2021-01-06 MED ORDER — BORIC ACID EX GRAN: GRANULES | CUTANEOUS | 5 refills | Status: DC

## 2021-01-06 NOTE — Telephone Encounter (Signed)
Pt called requesting refill of Boric Acid. Rx sent. Pt was sent a MyChart message letting her know that she is due for annual and she can schedule this by calling [phone].

## 2021-01-27 ENCOUNTER — Other Ambulatory Visit: Payer: Self-pay

## 2021-01-27 ENCOUNTER — Encounter: Payer: Self-pay | Admitting: Obstetrics and Gynecology

## 2021-01-27 ENCOUNTER — Other Ambulatory Visit (HOSPITAL_COMMUNITY)
Admission: RE | Admit: 2021-01-27 | Discharge: 2021-01-27 | Disposition: A | Discharge status: Home / Self Care | Payer: 59 | Source: Ambulatory Visit | Attending: Obstetrics and Gynecology | Admitting: Obstetrics and Gynecology

## 2021-01-27 ENCOUNTER — Ambulatory Visit (INDEPENDENT_AMBULATORY_CARE_PROVIDER_SITE_OTHER): Payer: 59 | Admitting: Obstetrics and Gynecology

## 2021-01-27 VITALS — BP 113/68 | HR 82 | Resp 16 | Ht 61.0 in | Wt 195.0 lb

## 2021-01-27 DIAGNOSIS — N898 Other specified noninflammatory disorders of vagina: Secondary | ICD-10-CM | POA: Insufficient documentation

## 2021-01-27 DIAGNOSIS — L292 Pruritus vulvae: Secondary | ICD-10-CM | POA: Diagnosis not present

## 2021-01-27 DIAGNOSIS — K649 Unspecified hemorrhoids: Secondary | ICD-10-CM | POA: Insufficient documentation

## 2021-01-27 DIAGNOSIS — R300 Dysuria: Secondary | ICD-10-CM

## 2021-01-27 LAB — POCT URINALYSIS DIPSTICK
Appearance: NORMAL
Bilirubin, UA: NEGATIVE
Blood, UA: NEGATIVE
Glucose, UA: NEGATIVE
Ketones, UA: NEGATIVE
Leukocytes, UA: NEGATIVE
Nitrite, UA: NEGATIVE
Protein, UA: NEGATIVE
Spec Grav, UA: 1.01 (ref 1.010–1.025)
Urobilinogen, UA: NEGATIVE E.U./dL — AB
pH, UA: 7 (ref 5.0–8.0)

## 2021-01-27 NOTE — Progress Notes (Signed)
   GYNECOLOGY OFFICE NOTE  History:  40 y.o. [id number] here today for urinary discomfort and vaginal itching. No discharge that she has noted. Also having pain with bowel movements, h/o hemorrhoids and feeling like she significant difficulty and pain passing stool. Otherwise feeling well.    Past Medical History:  Diagnosis Date   Anemia    Atrial septal defect determined by imaging    Fibroids     Past Surgical History:  Procedure Laterality Date   CESAREAN SECTION     ESOPHAGOGASTRODUODENOSCOPY     2012 or 2013 In [place]     open heart surgery     ASD repair, age 86     Current Outpatient Medications:    Boric Acid GRAN, USE ONE CAPSULE VAGINALLY AS DIRECTED  600mg  caps, Disp: 21 g, Rfl: 5  The following portions of the patient's history were reviewed and updated as appropriate: allergies, current medications, past family history, past medical history, past social history, past surgical history and problem list.   Review of Systems:  Pertinent items noted in HPI and remainder of comprehensive ROS otherwise negative.   Objective:  Physical Exam BP 113/68   Pulse 82   Resp 16   Ht 5\' 1"  (1.549 m)   Wt 195 lb (88.5 kg)   LMP 01/20/2021   BMI 36.84 kg/m  CONSTITUTIONAL: Well-developed, well-nourished female in no acute distress.  HENT:  Normocephalic, atraumatic. External right and left ear normal. Oropharynx is clear and moist EYES: Conjunctivae and EOM are normal. Pupils are equal, round, and reactive to light. No scleral icterus.  NECK: Normal range of motion, supple, no masses SKIN: Skin is warm and dry. No rash noted. Not diaphoretic. No erythema. No pallor. NEUROLOGIC: Alert and oriented to person, place, and time. Normal reflexes, muscle tone coordination. No cranial nerve deficit noted. PSYCHIATRIC: Normal mood and affect. Normal behavior. Normal judgment and thought content. CARDIOVASCULAR: Normal heart rate noted RESPIRATORY: Effort  normal, no problems with respiration noted ABDOMEN: Soft, no distention noted.   PELVIC: Normal appearing external genitalia; normal appearing vaginal mucosa and cervix.  Scant discharge discharge noted.  pelvic cultures obtained.  MUSCULOSKELETAL: Normal range of motion. No edema noted.  Exam done with chaperone present.  Labs and Imaging No results found.  Assessment & Plan:   1. Vaginal itching Swabs sent - Cervicovaginal ancillary only( [facility])  2. Burning with urination - POCT Urinalysis Dipstick - Urine Culture  3. Hemorrhoids, unspecified hemorrhoid type Return to GI seen 2020 for followup Let us know if she needs new referral   Routine preventative health maintenance measures emphasized. Please refer to After Visit Summary for other counseling recommendations.   Return if symptoms worsen or fail to improve.   Feliz Beam, MD, [place] for [employer] [facility])

## 2021-01-28 LAB — CERVICOVAGINAL ANCILLARY ONLY
Bacterial Vaginitis (gardnerella): POSITIVE — AB
Candida Glabrata: NEGATIVE
Candida Vaginitis: NEGATIVE
Chlamydia: NEGATIVE
Comment: NEGATIVE
Comment: NEGATIVE
Comment: NEGATIVE
Comment: NEGATIVE
Comment: NEGATIVE
Comment: NORMAL
Neisseria Gonorrhea: NEGATIVE
Trichomonas: NEGATIVE

## 2021-01-29 ENCOUNTER — Other Ambulatory Visit: Payer: Self-pay | Admitting: Family Medicine

## 2021-01-29 DIAGNOSIS — N898 Other specified noninflammatory disorders of vagina: Secondary | ICD-10-CM

## 2021-01-29 LAB — URINE CULTURE
MICRO NUMBER:: 12454910
Result:: NO GROWTH
SPECIMEN QUALITY:: ADEQUATE

## 2021-01-29 MED ORDER — METRONIDAZOLE 500 MG PO TABS
500.0000 mg | ORAL_TABLET | Freq: Two times a day (BID) | ORAL | 0 refills | Status: DC
Start: 2021-01-29 — End: 2022-05-14

## 2021-09-04 ENCOUNTER — Other Ambulatory Visit: Payer: Self-pay | Admitting: Obstetrics and Gynecology

## 2021-09-04 DIAGNOSIS — R928 Other abnormal and inconclusive findings on diagnostic imaging of breast: Secondary | ICD-10-CM

## 2021-09-16 ENCOUNTER — Ambulatory Visit
Admission: RE | Admit: 2021-09-16 | Discharge: 2021-09-16 | Disposition: A | Discharge status: Home / Self Care | Payer: 59 | Source: Ambulatory Visit | Attending: Obstetrics and Gynecology | Admitting: Obstetrics and Gynecology

## 2021-09-16 ENCOUNTER — Other Ambulatory Visit: Payer: Self-pay | Admitting: Obstetrics and Gynecology

## 2021-09-16 DIAGNOSIS — R928 Other abnormal and inconclusive findings on diagnostic imaging of breast: Secondary | ICD-10-CM

## 2021-09-16 DIAGNOSIS — N6310 Unspecified lump in the right breast, unspecified quadrant: Secondary | ICD-10-CM

## 2022-03-17 ENCOUNTER — Other Ambulatory Visit: Payer: Self-pay | Admitting: Obstetrics and Gynecology

## 2022-03-17 DIAGNOSIS — N6310 Unspecified lump in the right breast, unspecified quadrant: Secondary | ICD-10-CM

## 2022-03-23 ENCOUNTER — Other Ambulatory Visit: Payer: 59

## 2022-05-03 ENCOUNTER — Telehealth: Payer: Self-pay | Admitting: *Deleted

## 2022-05-03 NOTE — Telephone Encounter (Signed)
Left patient a message to call the office to change appointment to a virtual MyChart visit for an earlier time or reschedule appointment due to Dr. Nelda Marseille not being in the office and Dr. Gala Romney having to leave at 3:30 PM.

## 2022-05-04 ENCOUNTER — Ambulatory Visit: Payer: 59 | Admitting: Obstetrics & Gynecology

## 2022-05-11 ENCOUNTER — Encounter: Payer: Self-pay | Admitting: Obstetrics & Gynecology

## 2022-05-11 ENCOUNTER — Ambulatory Visit (INDEPENDENT_AMBULATORY_CARE_PROVIDER_SITE_OTHER): Payer: 59 | Admitting: Obstetrics & Gynecology

## 2022-05-11 ENCOUNTER — Other Ambulatory Visit (HOSPITAL_COMMUNITY)
Admission: RE | Admit: 2022-05-11 | Discharge: 2022-05-11 | Disposition: A | Discharge status: Home / Self Care | Payer: 59 | Source: Ambulatory Visit | Attending: Obstetrics & Gynecology | Admitting: Obstetrics & Gynecology

## 2022-05-11 VITALS — BP 112/74 | HR 84 | Ht 61.0 in | Wt 211.0 lb

## 2022-05-11 DIAGNOSIS — N760 Acute vaginitis: Secondary | ICD-10-CM

## 2022-05-11 DIAGNOSIS — B9689 Other specified bacterial agents as the cause of diseases classified elsewhere: Secondary | ICD-10-CM

## 2022-05-11 DIAGNOSIS — Z01419 Encounter for gynecological examination (general) (routine) without abnormal findings: Secondary | ICD-10-CM

## 2022-05-11 MED ORDER — BORIC ACID EX GRAN: GRANULES | CUTANEOUS | 6 refills | Status: DC

## 2022-05-11 NOTE — Progress Notes (Signed)
WELL-WOMAN EXAMINATION Patient name: Tiffany Dixon MRN [id number]  Date of birth: Oct 08, 1980 Chief Complaint:   Annual Exam  History of Present Illness:   Tiffany Dixon is a 42 y.o. G24P2012 female being seen today for a routine well-woman exam and the following concerns:  -Recurrent vaginitis: Typically reports that immediately before and/or after period will get a BV infection.  Mostly odor/discharge and will resolve with boric acid for a few days.  She ran out of boric acid and notes odorous white discharge currently.  Patient's last menstrual period was 04/29/2022 (approximate). Denies issues with her menses. Regular each month- last for about 5 days.   Mild dysmenorhea.  S/p ablation  The current method of family planning is tubal ligation.   Last pap March 2022  Last mammogram: completed May 2023- scheduled to go back. Last colonoscopy: n/a      No data to display            Review of Systems:   Pertinent items are noted in HPI Denies any headaches, blurred vision, fatigue, shortness of breath, chest pain, abdominal pain, bowel movements, urination, or intercourse unless otherwise stated above.  Pertinent History Reviewed:  Reviewed past medical,surgical, social and family history.  Reviewed problem list, medications and allergies. Physical Assessment:   Vitals:   05/11/22 0950  BP: 112/74  Pulse: 84  Weight: 211 lb (95.7 kg)  Height: '5\' 1"'$  (1.549 m)  Body mass index is 39.87 kg/m.        Physical Examination:   General appearance - well appearing, and in no distress  Mental status - alert, oriented to person, place, and time  Psych:  She has a normal mood and affect  Skin - warm and dry, normal color, no suspicious lesions noted  Chest - effort normal, all lung fields clear to auscultation bilaterally  Heart - normal rate and regular rhythm  Neck:  midline trachea, no thyromegaly or nodules  Breasts - breasts appear normal, no suspicious masses, no skin  or nipple changes or  axillary nodes.  Dense breast tissue appreciated bilaterally  Abdomen - soft, nontender, nondistended, no masses or organomegaly  Pelvic - VULVA: normal appearing vulva with no masses, tenderness or lesions  VAGINA: normal appearing vagina, frothy white discharge noted, no lesions  CERVIX: normal appearing cervix without discharge or lesions, no CMT  Thin prep pap is done with HR HPV cotesting  UTERUS: uterus is felt to be normal size, shape, consistency and nontender   ADNEXA: No adnexal masses or tenderness noted- exam limited due to body habitus  Extremities:  No calf tenderness bilaterally  Chaperone:  Deanna Sola      Assessment & Plan:  1) Well-Woman Exam -pap collected, reviewed screening guidelines -mammogram up to date and pt aware of follow up  2) Recurrent vaginitis -Rx for boric acid prn  Meds:  Meds ordered this encounter  Medications   Boric Acid GRAN    Sig: USE ONE CAPSULE VAGINALLY AS DIRECTED  '600mg'$  caps    Dispense:  21 g    Refill:  6    Follow-up: Return in about 1 year (around 05/12/2023) for Annual.   Janyth Pupa, DO Attending [place], [place] for [place], [place]

## 2022-05-13 LAB — CYTOLOGY - PAP
Adequacy: ABSENT
Comment: NEGATIVE
Diagnosis: NEGATIVE
High risk HPV: NEGATIVE

## 2022-05-14 ENCOUNTER — Other Ambulatory Visit: Payer: Self-pay | Admitting: Obstetrics & Gynecology

## 2022-05-14 DIAGNOSIS — N898 Other specified noninflammatory disorders of vagina: Secondary | ICD-10-CM

## 2022-05-14 MED ORDER — METRONIDAZOLE 500 MG PO TABS: 500.0000 mg | ORAL_TABLET | Freq: Two times a day (BID) | ORAL | 2 refills | Status: DC

## 2022-05-14 NOTE — Progress Notes (Signed)
Rx for Flagyl due to BV

## 2023-05-02 IMAGING — MG MM DIGITAL DIAGNOSTIC UNILAT*R* W/ TOMO W/ CAD
8 series · 8 of 24 positions shown · non-contrast
Comparison: Baseline screening mammogram dated 09/03/2021.

CLINICAL DATA: Patient returns today to evaluate 2 possible masses
identified on a recent baseline screening mammogram.

EXAM:
DIGITAL DIAGNOSTIC UNILATERAL RIGHT MAMMOGRAM WITH TOMOSYNTHESIS AND
CAD; ULTRASOUND RIGHT BREAST LIMITED
TECHNIQUE: Right digital diagnostic mammography and breast tomosynthesis was
performed. The images were evaluated with computer-aided detection.;
Targeted ultrasound examination of the right breast was performed

[R MLO synth-2D]
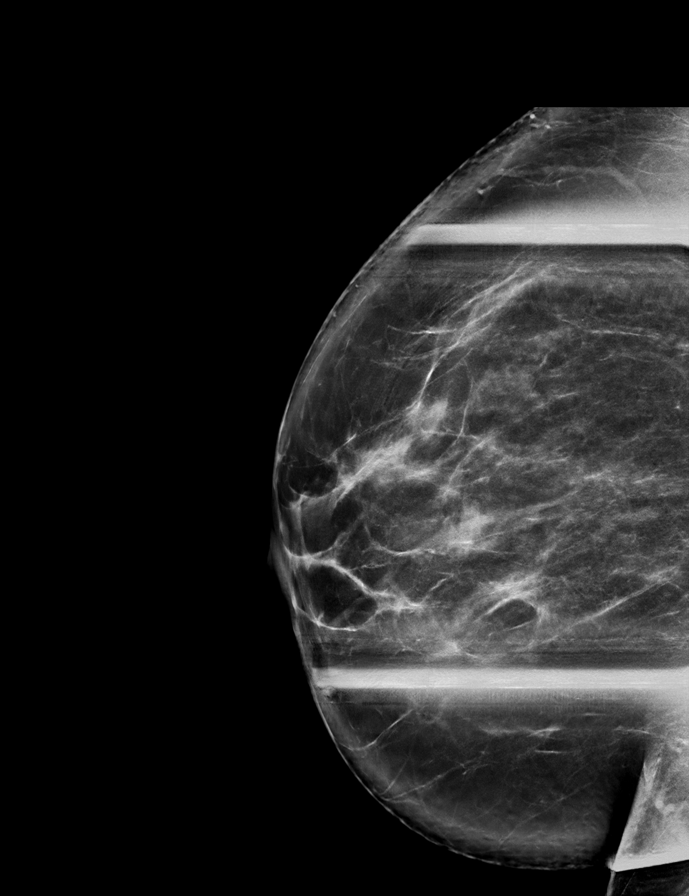

[R CC synth-2D (1 of 3)]
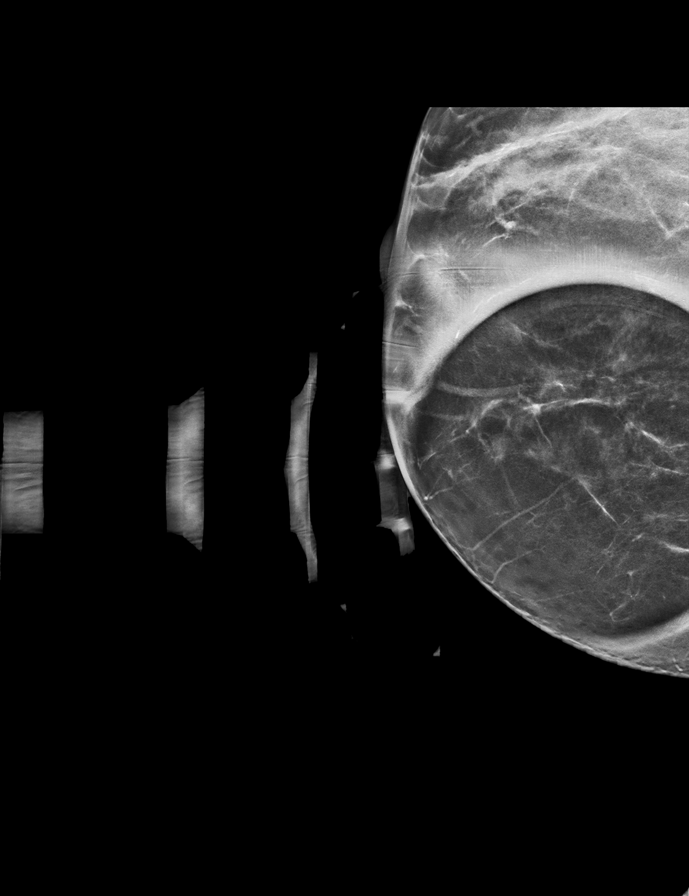

[R CC synth-2D (2 of 3)]
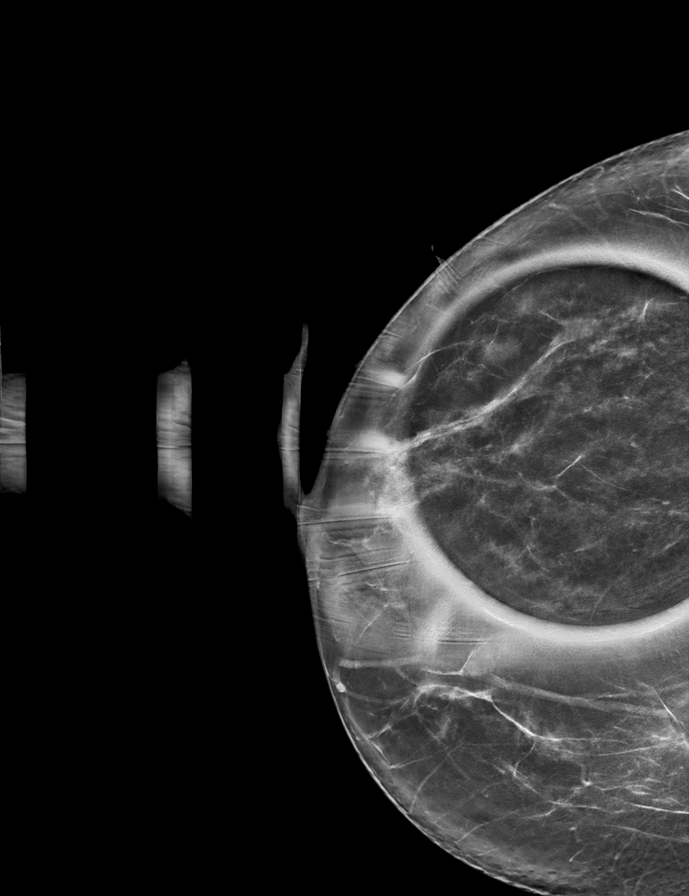

[R CC synth-2D (3 of 3)]
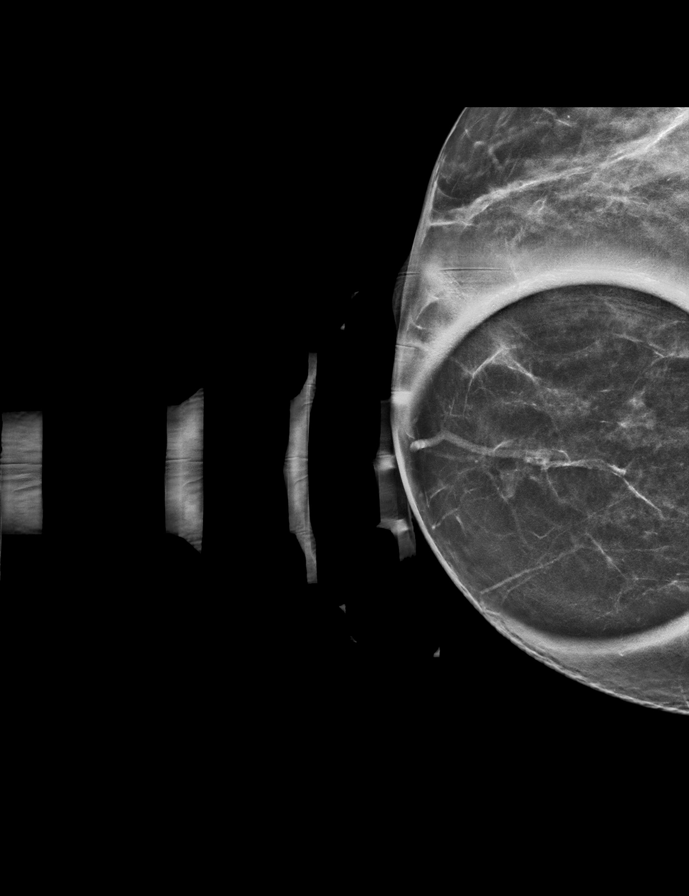

[R CC tomo (1 of 3) · tomo slice 33/64.0]
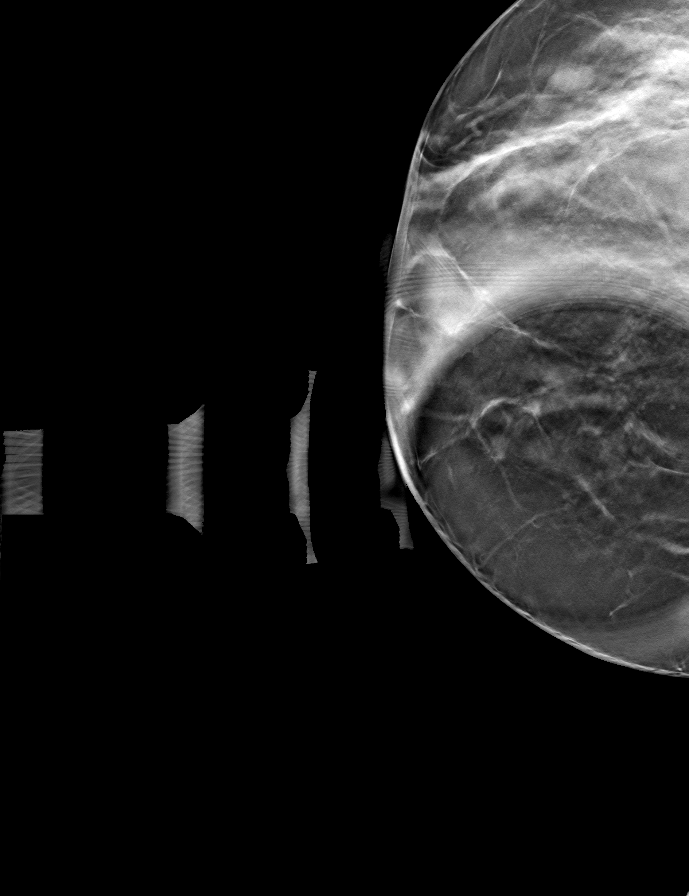

[R MLO tomo · tomo slice 48/95.0]
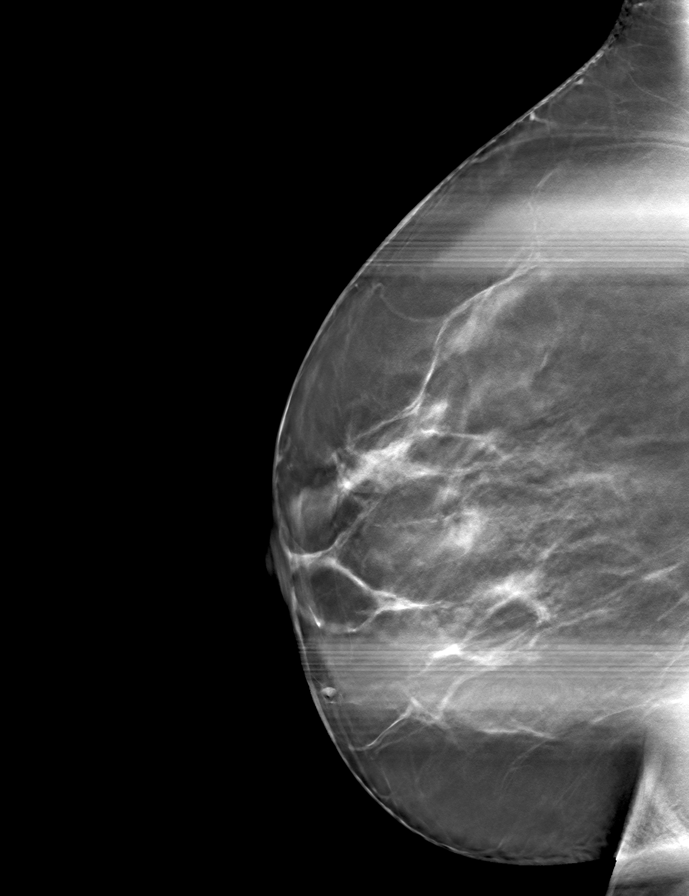

[R CC tomo (2 of 3) · tomo slice 33/65.0]
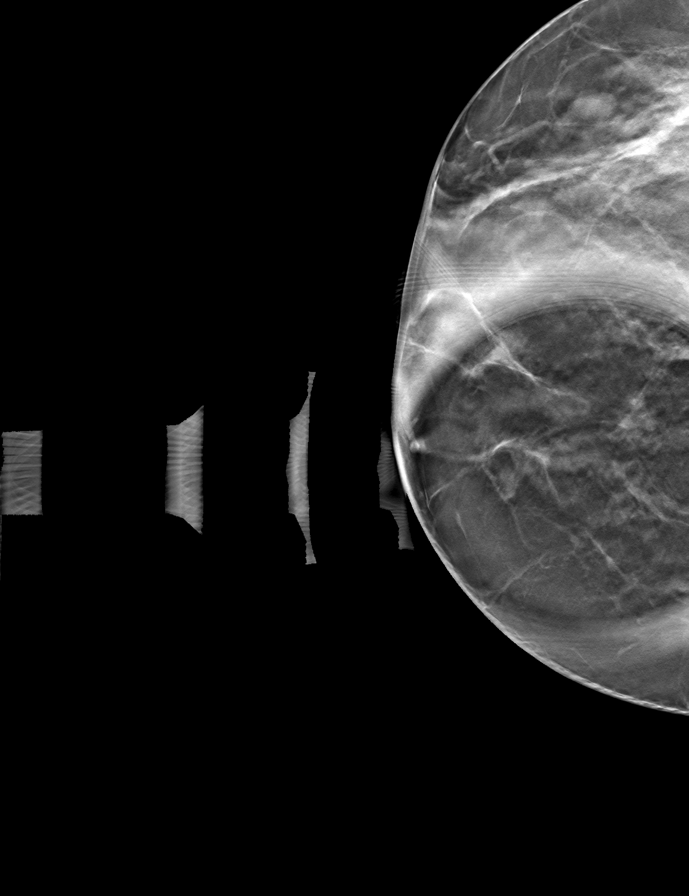

[R CC tomo (3 of 3) · tomo slice 39/76.0]
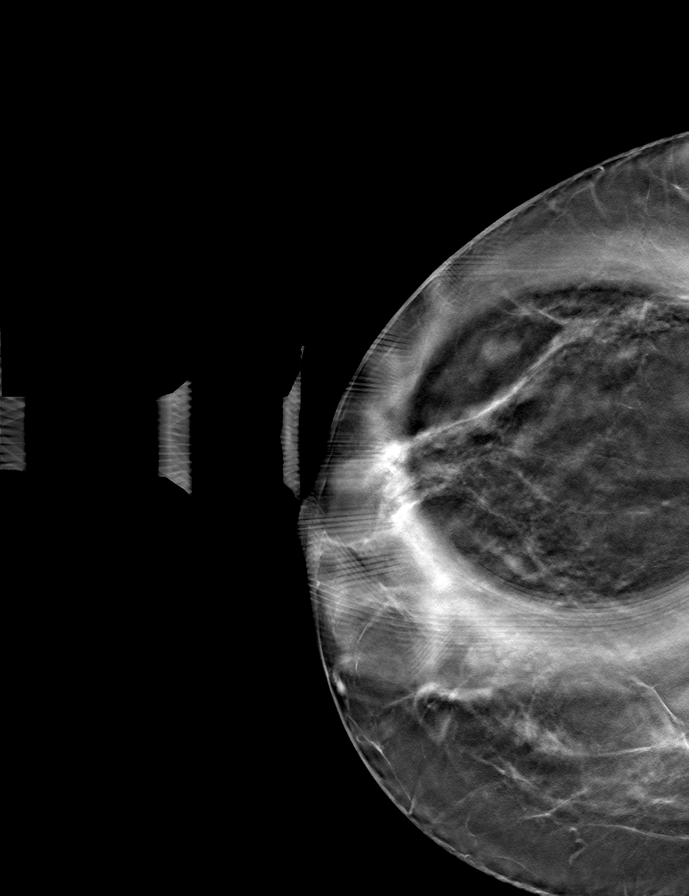

[8 of 24 positions shown; findings below may reference images not displayed]

ACR Breast Density Category b: There are scattered areas of
fibroglandular density.
FINDINGS: On today's additional diagnostic views with spot compression and 3D
tomosynthesis, partially obscured masses are confirmed within the
inner and outer RIGHT breast, each measuring approximately 1 cm
greatest dimension.

Targeted ultrasound is performed, showing an oval circumscribed
hypoechoic mass in the RIGHT breast at the 1 o'clock axis, 5 cm from
the nipple, measuring 9 x 3 x 8 mm, corresponding to the
mammographic finding and most compatible with a benign fibroadenoma
or complicated cyst/apocrine metaplasia.

There is an additional oval circumscribed hypoechoic mass in the
RIGHT breast at the 9 o'clock axis, 3 cm from the nipple, measuring
1.6 x 0.5 x 0.8 cm, corresponding to the additional mammographic
finding and also most compatible with a benign fibroadenoma or
complicated cyst/apocrine metaplasia.
IMPRESSION: Probably benign masses within the RIGHT breast at the 1 o'clock axis
and 9 o'clock axis, as detailed above, corresponding to the
mammographic findings. Recommend follow-up RIGHT breast diagnostic
mammogram and ultrasound in 6 months to ensure stability.

RECOMMENDATION:
RIGHT breast diagnostic mammogram and ultrasound in 6 months.

I have discussed the findings and recommendations with the patient.
If applicable, a reminder letter will be sent to the patient
regarding the next appointment.

BI-RADS CATEGORY  3: Probably benign.

## 2023-12-30 ENCOUNTER — Ambulatory Visit
Admission: RE | Admit: 2023-12-30 | Discharge: 2023-12-30 | Disposition: A | Discharge status: Home / Self Care | Source: Ambulatory Visit | Attending: Family Medicine | Admitting: Family Medicine

## 2023-12-30 ENCOUNTER — Other Ambulatory Visit: Payer: Self-pay

## 2023-12-30 VITALS — BP 129/86 | HR 105 | Temp 98.6°F | Resp 20

## 2023-12-30 DIAGNOSIS — J069 Acute upper respiratory infection, unspecified: Secondary | ICD-10-CM

## 2023-12-30 DIAGNOSIS — R319 Hematuria, unspecified: Secondary | ICD-10-CM | POA: Diagnosis not present

## 2023-12-30 DIAGNOSIS — R062 Wheezing: Secondary | ICD-10-CM | POA: Diagnosis not present

## 2023-12-30 LAB — POCT URINALYSIS DIP (MANUAL ENTRY)
Bilirubin, UA: NEGATIVE
Glucose, UA: NEGATIVE mg/dL
Ketones, POC UA: NEGATIVE mg/dL
Leukocytes, UA: NEGATIVE
Nitrite, UA: NEGATIVE
Protein Ur, POC: NEGATIVE mg/dL
Spec Grav, UA: 1.02 (ref 1.010–1.025)
Urobilinogen, UA: 0.2 U/dL
pH, UA: 7 (ref 5.0–8.0)

## 2023-12-30 MED ORDER — HYDROCOD POLI-CHLORPHE POLI ER 10-8 MG/5ML PO SUER: 5.0000 mL | Freq: Every evening | ORAL | 0 refills | Status: AC | PRN

## 2023-12-30 MED ORDER — PREDNISONE 20 MG PO TABS: 40.0000 mg | ORAL_TABLET | Freq: Every day | ORAL | 0 refills | Status: AC

## 2023-12-30 MED ORDER — AMOXICILLIN-POT CLAVULANATE 875-125 MG PO TABS: 1.0000 | ORAL_TABLET | Freq: Two times a day (BID) | ORAL | 0 refills | Status: AC

## 2023-12-30 NOTE — ED Provider Notes (Signed)
 Tiffany Dixon    CSN: [id number] Arrival date & time: 12/30/23  1539      History   Chief Complaint Chief Complaint  Patient presents with   Cough   Urinary Frequency    HPI Tiffany Dixon is a 43 y.o. female.   Patient states she has had a cough for 2 weeks.  Postnasal drip.  Wheezing and tickling in her throat at night.  Difficulty sleeping at night.  No history of asthma or lung disease.  She has noticed she states that she is not getting better with over-the-counter medications.  She is not coughing up any appreciable sputum.  Has small amounts of thick white sputum.  In addition patient states she has been having some urinary symptoms.  Urinary frequency and at times dysuria.  This has been going on for about 2 weeks as well.  Last menstrual period was about 2 months ago.  They are irregular.  No vaginal symptoms.  No abdominal pain.  No flank pain, no nausea vomiting, no fever or chills    Past Medical History:  Diagnosis Date   Anemia    Atrial septal defect determined by imaging    Fibroids     Patient Active Problem List   Diagnosis Date Noted   H/O heart surgery 12/15/2011   H/O cesarean section 06/25/2011    Past Surgical History:  Procedure Laterality Date   CESAREAN SECTION     ESOPHAGOGASTRODUODENOSCOPY     2012 or 2013 In [place]   FOOT SURGERY     open heart surgery     ASD repair, age 69    OB History     Gravida  3   Para  2   Term  2   Preterm      AB  1   Living  2      SAB  1   IAB      Ectopic      Multiple      Live Births               Home Medications    Prior to Admission medications   Medication Sig Start Date End Date Taking? Authorizing Provider  amoxicillin -clavulanate (AUGMENTIN ) 875-125 MG tablet Take 1 tablet by mouth every 12 (twelve) hours. 12/30/23  Yes Maranda Jamee Jacob, MD  chlorpheniramine-HYDROcodone (TUSSIONEX) 10-8 MG/5ML Take 5 mLs by mouth at bedtime as needed for cough.  12/30/23  Yes Maranda Jamee Jacob, MD  predniSONE  (DELTASONE ) 20 MG tablet Take 2 tablets (40 mg total) by mouth daily with breakfast. 12/30/23  Yes Maranda Jamee Jacob, MD    Family History Family History  Problem Relation Age of Onset   Melanoma Mother    Colon cancer Other    Esophageal cancer Neg Hx    Stomach cancer Neg Hx    Rectal cancer Neg Hx     Social History Social History   Tobacco Use   Smoking status: Former    Types: Cigars   Smokeless tobacco: Never   Tobacco comments:    Ocassionally smoking  Vaping Use   Vaping status: Never Used  Substance Use Topics   Alcohol use: Yes    Comment: Socially   Drug use: No     Allergies   Terbinafine   Review of Systems Review of Systems See HPI  Physical Exam Triage Vital Signs ED Triage Vitals  Encounter Vitals Group     BP 12/30/23 1551 129/86  Girls Systolic BP Percentile --      Girls Diastolic BP Percentile --      Boys Systolic BP Percentile --      Boys Diastolic BP Percentile --      Pulse Rate 12/30/23 1551 (!) 105     Resp 12/30/23 1551 20     Temp 12/30/23 1551 98.6 F (37 C)     Temp src --      SpO2 12/30/23 1551 98 %     Weight --      Height --      Head Circumference --      Peak Flow --      Pain Score 12/30/23 1555 3     Pain Loc --      Pain Education --      Exclude from Growth Chart --    No data found.  Updated Vital Signs BP 129/86   Pulse (!) 105   Temp 98.6 F (37 C)   Resp 20   LMP 11/25/2023 (Approximate)   SpO2 98%       Physical Exam Constitutional:      General: She is not in acute distress.    Appearance: She is well-developed and normal weight. She is ill-appearing.  HENT:     Head: Normocephalic and atraumatic.     Right Ear: Tympanic membrane normal.     Left Ear: Tympanic membrane normal.     Nose: Congestion and rhinorrhea present.     Mouth/Throat:     Pharynx: Posterior oropharyngeal erythema present.  Eyes:     Conjunctiva/sclera:  Conjunctivae normal.     Pupils: Pupils are equal, round, and reactive to light.  Cardiovascular:     Rate and Rhythm: Normal rate and regular rhythm.     Heart sounds: Normal heart sounds.  Pulmonary:     Effort: Pulmonary effort is normal. No respiratory distress.     Breath sounds: Wheezing and rhonchi present.  Abdominal:     General: There is no distension.     Palpations: Abdomen is soft.  Musculoskeletal:        General: Normal range of motion.     Cervical back: Normal range of motion.  Lymphadenopathy:     Cervical: No cervical adenopathy.  Skin:    General: Skin is warm and dry.  Neurological:     Mental Status: She is alert.      UC Treatments / Results  Labs (all labs ordered are listed, but only abnormal results are displayed) Labs Reviewed  POCT URINALYSIS DIP (MANUAL ENTRY) - Abnormal; Notable for the following components:      Result Value   Clarity, UA cloudy (*)    Blood, UA moderate (*)    All other components within normal limits  URINE CULTURE    EKG   Radiology No results found.  Procedures Procedures (including critical Dixon time)  Medications Ordered in UC Medications - No data to display  Initial Impression / Assessment and Plan / UC Course  I have reviewed the triage vital signs and the nursing notes.  Pertinent labs & imaging results that were available during my Dixon of the patient were reviewed by me and considered in my medical decision making (see chart for details).     With coughing and wheezing of 2 weeks duration feel trial of antibiotic is indicated. Will send urine for culture because of hematuria and urinary symptoms. Final Clinical Impressions(s) / UC Diagnoses   Final  diagnoses:  Hematuria, unspecified type  URI with cough and congestion  Wheezing     Discharge Instructions      Take the antibiotic 2 times a day.  Take the antibiotic with food Take prednisone  once a day for 5 days.  This helps with the  bronchial inflammation and wheezing I have given you a stronger cough medicine to take at bedtime.  This should help with the coughing.  It may make you drowsy Make sure you are drinking lots of water Your urine has been sent to the laboratory for culture.  You will be called if any change in antibiotic is needed   ED Prescriptions     Medication Sig Dispense Auth. Provider   amoxicillin -clavulanate (AUGMENTIN ) 875-125 MG tablet Take 1 tablet by mouth every 12 (twelve) hours. 14 tablet Maranda Jamee Jacob, MD   predniSONE  (DELTASONE ) 20 MG tablet Take 2 tablets (40 mg total) by mouth daily with breakfast. 10 tablet Maranda Jamee Jacob, MD   chlorpheniramine-HYDROcodone (TUSSIONEX) 10-8 MG/5ML Take 5 mLs by mouth at bedtime as needed for cough. 115 mL Maranda Jamee Jacob, MD      I have reviewed the PDMP during this encounter.   Maranda Jamee Jacob, MD 12/30/23 [id number]

## 2023-12-30 NOTE — Discharge Instructions (Addendum)
 Take the antibiotic 2 times a day.  Take the antibiotic with food Take prednisone  once a day for 5 days.  This helps with the bronchial inflammation and wheezing I have given you a stronger cough medicine to take at bedtime.  This should help with the coughing.  It may make you drowsy Make sure you are drinking lots of water Your urine has been sent to the laboratory for culture.  You will be called if any change in antibiotic is needed

## 2023-12-30 NOTE — ED Triage Notes (Addendum)
 Has c/o congestion, itchy throat, and coughing at night for 2 weeks. Reports she has been wheezing and having a whistling noise in her throat. No fever. Also thinks she has a uti, has been frequently urinating and bladder cramping x 2 weeks. Was taking mucinex and allergy pill but stopped, sts it made her resp symptoms worse.

## 2024-01-01 LAB — URINE CULTURE
Culture: NO GROWTH
Special Requests: NORMAL
